# Patient Record
Sex: Female | Born: 1990 | Race: White | Hispanic: No | Marital: Single | Smoking: Former smoker
Health system: Southern US, Community
[De-identification: ages and names within clinical notes are randomized; demographics above are authoritative.]

## PROBLEM LIST (undated history)

## (undated) ENCOUNTER — Inpatient Hospital Stay: Payer: Self-pay

## (undated) DIAGNOSIS — F329 Major depressive disorder, single episode, unspecified: Secondary | ICD-10-CM

## (undated) DIAGNOSIS — Z8742 Personal history of other diseases of the female genital tract: Secondary | ICD-10-CM

## (undated) DIAGNOSIS — F419 Anxiety disorder, unspecified: Secondary | ICD-10-CM

## (undated) HISTORY — DX: Anxiety disorder, unspecified: F41.9

## (undated) HISTORY — PX: NO PAST SURGERIES: SHX2092

## (undated) HISTORY — DX: Personal history of other diseases of the female genital tract: Z87.42

## (undated) HISTORY — DX: Major depressive disorder, single episode, unspecified: F32.9

---
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
## 2008-05-25 ENCOUNTER — Ambulatory Visit: Payer: Self-pay | Admitting: Advanced Practice Midwife

## 2008-10-06 ENCOUNTER — Observation Stay: Payer: Self-pay | Admitting: Obstetrics & Gynecology

## 2008-10-22 ENCOUNTER — Inpatient Hospital Stay: Payer: Self-pay | Admitting: Obstetrics and Gynecology

## 2012-06-10 LAB — HM PAP SMEAR: HM Pap smear: NORMAL

## 2014-03-09 DIAGNOSIS — F329 Major depressive disorder, single episode, unspecified: Secondary | ICD-10-CM | POA: Insufficient documentation

## 2014-03-09 DIAGNOSIS — F419 Anxiety disorder, unspecified: Secondary | ICD-10-CM

## 2014-03-09 DIAGNOSIS — F32A Depression, unspecified: Secondary | ICD-10-CM

## 2014-03-09 HISTORY — DX: Depression, unspecified: F32.A

## 2014-03-09 HISTORY — DX: Anxiety disorder, unspecified: F41.9

## 2014-06-10 NOTE — L&D Delivery Note (Signed)
Delivery Summary for Savannah AuerLora E Kaucher  Labor Events:   Preterm labor:   Rupture date:   Rupture time:   Rupture type: Spontaneous  Fluid Color: Clear  Induction:   Augmentation:   Complications:   Cervical ripening:          Delivery:   Episiotomy:   Lacerations:   Repair suture:   Repair # of packets:   Blood loss (ml): 200 ml   Information for the patient's newborn:  Luna KitchensWinn, Boy Olie [[id number]][[id number]]    Delivery 06/07/2015 10:46 AM by  Vaginal, Spontaneous Delivery Sex:  female Gestational Age: 5025w3d Delivery Clinician:  Nadara Mustardobert P Harris Living?: Yes        APGARS  One minute Five minutes Ten minutes  Skin color: 0   1   1    Heart rate: 1   2   2     Grimace: 0   1   1    Muscle tone: 0   0   1    Breathing: 0   1   2    Totals: 1  5  7     Presentation/position: Vertex     Resuscitation: Yes = See NRP documentation in Infant Chart See NICU Consult (infant's chart)  Cord information: 3 vessels   Disposition of cord blood: No    Blood gases sent? Yes Complications: None  Placenta: Delivered: 06/07/2015 10:56 AM  Spontaneous  Intact appearance Newborn Measurements: Weight: 6 lb 2.8 oz (2800 g)  Height: 18.9"  Head circumference:    Chest circumference:    Other providers: Delivery Nurse Transition RN Registered Nurse Obstetrician Neonatologist Transition RN Gwendolyn Grantatherine H Gaither Myrtha MantisKiara K Jacobs [facility] Joffrey Kerce Christie Davanzo Myrtha MantisKiara K Jacobs  Additional  information: Forceps:   Vacuum:   Breech:   Observed anomalies         Delivery Note At 10:46 AM a viable female was delivered via Vaginal, Spontaneous Delivery (Presentation: cephalic; LOA ).  Delivery was precipitous.  Delivery of fetus was performed by Dr. Annamarie MajorPaul Harris, first responder to precipitous delivery.  APGAR: 1, 5, 7; weight 6 lb 2.8 oz (2800 g).   Placenta status: Intact, Spontaneous.  Cord: 3 vessels with the following complications: None.  Cord pH: 7.15.  Placenta was delivered by Dr. Hildred LaserAnika  Jonita Hirota.   Anesthesia: None  Episiotomy: None Lacerations: None Suture Repair: None Est. Blood Loss (mL): 200  Mom to postpartum.  Baby to NICU.  Hildred Lasernika Monserath Neff 06/08/2015, 8:52 AM

## 2014-11-28 ENCOUNTER — Ambulatory Visit (INDEPENDENT_AMBULATORY_CARE_PROVIDER_SITE_OTHER): Payer: Self-pay

## 2014-11-28 VITALS — BP 114/72 | HR 80 | Ht 64.6 in | Wt 123.4 lb

## 2014-11-28 DIAGNOSIS — Z331 Pregnant state, incidental: Secondary | ICD-10-CM

## 2014-11-28 DIAGNOSIS — Z1389 Encounter for screening for other disorder: Secondary | ICD-10-CM

## 2014-11-28 DIAGNOSIS — Z36 Encounter for antenatal screening of mother: Secondary | ICD-10-CM

## 2014-11-28 DIAGNOSIS — Z113 Encounter for screening for infections with a predominantly sexual mode of transmission: Secondary | ICD-10-CM

## 2014-11-28 DIAGNOSIS — Z369 Encounter for antenatal screening, unspecified: Secondary | ICD-10-CM

## 2014-11-28 DIAGNOSIS — Z8742 Personal history of other diseases of the female genital tract: Secondary | ICD-10-CM

## 2014-11-28 DIAGNOSIS — Z3490 Encounter for supervision of normal pregnancy, unspecified, unspecified trimester: Secondary | ICD-10-CM

## 2014-11-28 DIAGNOSIS — Z3687 Encounter for antenatal screening for uncertain dates: Secondary | ICD-10-CM

## 2014-11-28 NOTE — Patient Instructions (Signed)
Pt. To have dating ultrasound and NT if dates apply at this time.  Pt. Would also like msAFP in 2nd trimester.

## 2014-11-28 NOTE — Progress Notes (Signed)
ZIKA EXPOSURE SCREEN:  The patient has not traveled to a [place] Virus endemic area within the past 6 months, nor has she had unprotected sex with a partner who has travelled to a [place] endemic region within the past 6 months. The patient has been advised to notify us if these factors change any time during this current pregnancy, so adequate testing and monitoring can be initiated.Lise Auer for NOB nurse interview visit. G-2.   P1001-. Pregnancy eduction material explained and given. No cats in the home. Drug screen consent form signed and urine specimen sent to lab. NOB labs ordered. HIV consent form signed. PNV encouraged. NT ordered/msAFP/CYSTIC FIBROSIS ordered. Pt. To follow up with provider after dating scan for NOB physical.  All questions answered.

## 2014-11-30 ENCOUNTER — Other Ambulatory Visit: Payer: BLUE CROSS/BLUE SHIELD

## 2014-11-30 ENCOUNTER — Other Ambulatory Visit: Payer: Self-pay

## 2014-11-30 DIAGNOSIS — Z1389 Encounter for screening for other disorder: Secondary | ICD-10-CM

## 2014-11-30 DIAGNOSIS — Z3687 Encounter for antenatal screening for uncertain dates: Secondary | ICD-10-CM

## 2014-11-30 DIAGNOSIS — Z369 Encounter for antenatal screening, unspecified: Secondary | ICD-10-CM

## 2014-11-30 DIAGNOSIS — Z3491 Encounter for supervision of normal pregnancy, unspecified, first trimester: Secondary | ICD-10-CM | POA: Diagnosis not present

## 2014-11-30 DIAGNOSIS — Z113 Encounter for screening for infections with a predominantly sexual mode of transmission: Secondary | ICD-10-CM

## 2014-11-30 DIAGNOSIS — Z331 Pregnant state, incidental: Secondary | ICD-10-CM | POA: Diagnosis not present

## 2014-11-30 DIAGNOSIS — Z3490 Encounter for supervision of normal pregnancy, unspecified, unspecified trimester: Secondary | ICD-10-CM

## 2014-11-30 DIAGNOSIS — Z36 Encounter for antenatal screening of mother: Secondary | ICD-10-CM | POA: Diagnosis not present

## 2014-12-02 LAB — CBC WITH DIFFERENTIAL/PLATELET
BASOS: 0 %
Basophils Absolute: 0 10*3/uL (ref 0.0–0.2)
EOS (ABSOLUTE): 0.1 10*3/uL (ref 0.0–0.4)
Eos: 1 %
HEMATOCRIT: 37.3 % (ref 34.0–46.6)
HEMOGLOBIN: 12.4 g/dL (ref 11.1–15.9)
Immature Grans (Abs): 0 10*3/uL (ref 0.0–0.1)
Immature Granulocytes: 0 %
LYMPHS ABS: 2.4 10*3/uL (ref 0.7–3.1)
Lymphs: 26 %
MCH: 31.6 pg (ref 26.6–33.0)
MCHC: 33.2 g/dL (ref 31.5–35.7)
MCV: 95 fL (ref 79–97)
Monocytes Absolute: 0.8 10*3/uL (ref 0.1–0.9)
Monocytes: 8 %
NEUTROS ABS: 5.9 10*3/uL (ref 1.4–7.0)
Neutrophils: 65 %
Platelets: 256 10*3/uL (ref 150–379)
RBC: 3.92 x10E6/uL (ref 3.77–5.28)
RDW: 14.4 % (ref 12.3–15.4)
WBC: 9.2 10*3/uL (ref 3.4–10.8)

## 2014-12-02 LAB — RUBELLA ANTIBODY, IGM

## 2014-12-02 LAB — ABO

## 2014-12-02 LAB — HIV ANTIBODY (ROUTINE TESTING W REFLEX): HIV SCREEN 4TH GENERATION: NONREACTIVE

## 2014-12-02 LAB — ANTIBODY SCREEN: Antibody Screen: NEGATIVE

## 2014-12-02 LAB — RH TYPE: Rh Factor: POSITIVE

## 2014-12-02 LAB — RPR: RPR Ser Ql: NONREACTIVE

## 2014-12-02 LAB — HEPATITIS B SURFACE ANTIGEN: Hepatitis B Surface Ag: NEGATIVE

## 2014-12-05 ENCOUNTER — Encounter: Payer: Self-pay | Admitting: Obstetrics and Gynecology

## 2014-12-05 LAB — VARICELLA ZOSTER ANTIBODY, IGM: VARICELLA IGM: 1 {index} — AB (ref 0.00–0.90)

## 2014-12-21 ENCOUNTER — Other Ambulatory Visit: Payer: Self-pay | Admitting: Obstetrics and Gynecology

## 2014-12-21 ENCOUNTER — Telehealth: Payer: Self-pay

## 2014-12-21 ENCOUNTER — Encounter: Payer: Self-pay | Admitting: Obstetrics and Gynecology

## 2014-12-21 ENCOUNTER — Ambulatory Visit: Payer: BLUE CROSS/BLUE SHIELD

## 2014-12-21 ENCOUNTER — Ambulatory Visit (INDEPENDENT_AMBULATORY_CARE_PROVIDER_SITE_OTHER): Payer: BLUE CROSS/BLUE SHIELD | Admitting: Obstetrics and Gynecology

## 2014-12-21 VITALS — BP 95/58 | HR 84 | Wt 127.4 lb

## 2014-12-21 DIAGNOSIS — F329 Major depressive disorder, single episode, unspecified: Secondary | ICD-10-CM

## 2014-12-21 DIAGNOSIS — Z1389 Encounter for screening for other disorder: Secondary | ICD-10-CM

## 2014-12-21 DIAGNOSIS — Z369 Encounter for antenatal screening, unspecified: Secondary | ICD-10-CM

## 2014-12-21 DIAGNOSIS — Z3401 Encounter for supervision of normal first pregnancy, first trimester: Secondary | ICD-10-CM

## 2014-12-21 DIAGNOSIS — F418 Other specified anxiety disorders: Secondary | ICD-10-CM

## 2014-12-21 DIAGNOSIS — Z36 Encounter for antenatal screening of mother: Secondary | ICD-10-CM | POA: Diagnosis not present

## 2014-12-21 DIAGNOSIS — Z283 Underimmunization status: Secondary | ICD-10-CM | POA: Insufficient documentation

## 2014-12-21 DIAGNOSIS — F32A Depression, unspecified: Secondary | ICD-10-CM

## 2014-12-21 DIAGNOSIS — Z3490 Encounter for supervision of normal pregnancy, unspecified, unspecified trimester: Secondary | ICD-10-CM

## 2014-12-21 DIAGNOSIS — O09899 Supervision of other high risk pregnancies, unspecified trimester: Secondary | ICD-10-CM | POA: Insufficient documentation

## 2014-12-21 DIAGNOSIS — Z331 Pregnant state, incidental: Secondary | ICD-10-CM

## 2014-12-21 DIAGNOSIS — O9989 Other specified diseases and conditions complicating pregnancy, childbirth and the puerperium: Secondary | ICD-10-CM

## 2014-12-21 DIAGNOSIS — F419 Anxiety disorder, unspecified: Secondary | ICD-10-CM

## 2014-12-21 DIAGNOSIS — Z3687 Encounter for antenatal screening for uncertain dates: Secondary | ICD-10-CM

## 2014-12-21 LAB — POCT URINALYSIS DIPSTICK
BILIRUBIN UA: NEGATIVE
Blood, UA: NEGATIVE
Glucose, UA: NEGATIVE
KETONES UA: NEGATIVE
LEUKOCYTES UA: NEGATIVE
NITRITE UA: POSITIVE
PH UA: 6.5
Protein, UA: NEGATIVE
Spec Grav, UA: 1.01
Urobilinogen, UA: 0.2

## 2014-12-21 NOTE — Progress Notes (Signed)
Subjective:    Savannah Richmond is being seen today for her first obstetrical visit.  This is not a planned pregnancy. She is at 29w3dgestation. Her obstetrical history is significant for none. Relationship with FOB: significant other, living together. Patient does intend to breast feed. Pregnancy history fully reviewed.  Menstrual History: Obstetric History   G2   P1   T1   P0   A0   TAB0   SAB0   E0   M0   L1     # Outcome Date GA Lbr Len/2nd Weight Sex Delivery Anes PTL Lv  2 Current           1 Term 10/22/08 428w3d7 lb 15 oz (3.6 kg) F Vag-Spont  N Y    Obstetric Comments  ZIKA EXPOSURE SCREEN:  The patient has not traveled to a [place] endemic area within the past 6 months, nor has she had unprotected sex with a partner who has travelled to a [place]emic region within the past 6 months. The patient has been advised  to notify usKoreaf these factors change any time during this current pregnancy, so adequate testing and monitoring can be initiated.LoCindra Evesor NOB nurse interview visit. G2 P1. Pregnancy eduction material explained and given. NOB labs ordered. HIV  consent form signed. PNV encouraged. NT ordered/declined/to discuss with provider. Pt. To follow up with provider today for NOB physical.  All questions answered.      Menarche age: [age]'s last menstrual period was 09/09/2014 (approximate).  Denies h/o abnormal pap smears or STIs.  Last pap last year, 2015, normal per pt ([facility] at [facility]    Past Medical History  Diagnosis Date  . History of irregular menstrual bleeding   . Anxiety and depression 03/09/2014    Past Surgical History  Procedure Laterality Date  . No past surgeries      Family History  Problem Relation Age of Onset  . Cancer Maternal Grandmother 7063  Started as breast cancer then mastasized-stage 4  . Cancer Maternal Grandfather     prostate cancer    History   Social History  . Marital Status: Single    Spouse Name: N/A   . Number of Children: N/A  . Years of Education: N/A   Occupational History  . 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 Smoking status: Former Smoker    Types: Cigarettes    Quit date: 11/07/2014  . Smokeless tobacco: Never Used  . Alcohol Use: No  . Drug Use: No  . Sexual Activity:    Partners: Male   Other Topics Concern  . Not on file   Social History Narrative   WORKS WITH [employer]    Current Outpatient Prescriptions on File Prior to Visit  Medication Sig Dispense Refill  . citalopram (CELEXA) 40 MG tablet Take 1 tablet by mouth daily.    . Prenatal Vit-Fe Fumarate-FA (MULTIVITAMIN-PRENATAL) 27-0.8 MG TABS tablet Take 1 tablet by mouth daily at 12 noon.     No current facility-administered medications on file prior to visit.    No Known Allergies    Review of Systems General:Not Present- Fever, Weight Loss and Weight Gain. Skin:Not Present- Rash. HEENT:Not Present- Blurred Vision, Headache and Bleeding Gums. Respiratory:Not Present- Difficulty Breathing. Breast:Not Present- Breast Mass. Cardiovascular:Not Present- Chest Pain, Elevated Blood Pressure, Fainting / Blacking Out and Shortness of Breath. Gastrointestinal:Not Present- Abdominal Pain, Constipation, Nausea and  Vomiting. Female Genitourinary:Not Present- Frequency, Painful Urination, Pelvic Pain, Vaginal Bleeding, Vaginal Discharge, Contractions, regular, Fetal Movements Decreased, Urinary Complaints and Vaginal Fluid. Musculoskeletal:Not Present- Back Pain and Leg Cramps. Neurological:Not Present- Dizziness. Psychiatric:Not Present- Depression.    Objective:   Blood pressure 95/58, pulse 84, weight 127 lb 6.4 oz (57.788 kg), last menstrual period 09/09/2014. Marland Kitchen General Appearance:    Alert, cooperative, no distress, appears stated age  Head:    Normocephalic, without obvious abnormality, atraumatic  Eyes:    PERRL, conjunctiva/corneas clear, EOM's intact, both eyes   Ears:    Normal external ear canals, both ears  Nose:   Nares normal, septum midline, mucosa normal, no drainage or sinus tenderness  Throat:   Lips, mucosa, and tongue normal; teeth and gums normal  Neck:   Supple, symmetrical, trachea midline, no adenopathy; thyroid: no enlargement/tenderness/nodules; no       carotid bruit or JVD  Back:     Symmetric, no curvature, ROM normal, no CVA tenderness  Lungs:     Clear to auscultation bilaterally, respirations unlabored  Chest Wall:    No tenderness or deformity   Heart:    Regular rate and rhythm, S1 and S2 normal, no murmur, rub or gallop  Breast Exam:    No tenderness, masses, or nipple abnormality  Abdomen:     Soft, non-tender, bowel sounds active all four quadrants, no masses, no organomegaly.  FHT 157 bpm  Genitalia:    Pelvic:external genitalia normal, vagina with lesions, discharge, or tenderness, rectovaginal septum       normal. Cervix normal in appearance, no cervical motion tenderness, no adnexal masses or tenderness.     Pregnancy positive findings: uterine enlargement: 12 wk size, nontender.   Rectal:    Normal external sphincter.  No hemorrhoids appreciated. Internal exam not done.   Extremities:   Extremities normal, atraumatic, no cyanosis or edema  Pulses:   2+ and symmetric all extremities  Skin:   Skin color, texture, turgor normal, no rashes or lesions  Lymph nodes:   Cervical, supraclavicular, and axillary nodes normal  Neurologic:   CNII-XII intact, normal strength, sensation and reflexes throughout      Assessment:    Pregnancy at 12 and 3/7 weeks    Plan:    Initial labs reveiwed. Rubella non-immune. Will need MMR postpartum.  Prenatal vitamins. Problem list reviewed and updated. AFP3 discussed: performed today.  Role of ultrasound in pregnancy discussed; fetal survey: to be ordered at appropriate gestational age. H/o anxiety and depression - risks vs benefits of antidepressant use in pregnancy discussed.  Encouraged patient to wean off Celexa. Currently on 40 mg daily.  To decrease to 20 mg x 2 weeks, then to wean off.  Advised to resume normal dose if she becomes symptomatic and can change to different anti-depressant at next visit.  New OB counseling:  The patient has been given an overview regarding routine prenatal care.  Recommendations regarding diet, weight gain, and exercise in pregnancy were given.  Prenatal testing, optional genetic testing, and ultrasound use in pregnancy were reviewed.  Benefits of Breast Feeding were discussed. The patient is encouraged to consider nursing her baby post partum. 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  Follow up in 4 weeks.   Rubie Maid, MD [facility]

## 2014-12-21 NOTE — Telephone Encounter (Signed)
Called pt per Dr.Cherry and informed her of the need to wean off Celexa. Advised pt that she needs to break 40 mg tablets in half for 1-2 weeks, then go to taking one tablet every other day and then stop medication completely. Pt gave verbal understanding, states that she has not tried to wean off previously. Advised pt that if her mood becomes unstable or she feels as if she cannot stop medication give us a call back and continue with medication.

## 2014-12-23 LAB — FIRST TRIMESTER SCREEN W/NT
CRL: 64 mm
DIA MOM: 0.9
DIA Value: 239.8 pg/mL
GEST AGE-COLLECT: 12.7 wk
HCG VALUE: 90.8 [IU]/mL
MATERNAL AGE AT EDD: 26.1 a
NUCHAL TRANSLUCENCY: 2 mm
NUMBER OF FETUSES: 1
Nuchal Translucency MoM: 1.43
PAPP-A MOM: 0.37
PAPP-A Value: 497.3 ng/mL
Test Results:: NEGATIVE
WEIGHT: 123 [lb_av]
hCG MoM: 0.89

## 2014-12-23 LAB — URINE CULTURE

## 2014-12-23 LAB — NUSWAB VAGINITIS PLUS (VG+)
Atopobium vaginae: HIGH Score — AB
BVAB 2: HIGH Score — AB
Candida glabrata, NAA: NEGATIVE
Megasphaera 1: HIGH Score — AB

## 2014-12-26 ENCOUNTER — Telehealth: Payer: Self-pay

## 2014-12-26 DIAGNOSIS — N760 Acute vaginitis: Principal | ICD-10-CM

## 2014-12-26 DIAGNOSIS — B9689 Other specified bacterial agents as the cause of diseases classified elsewhere: Secondary | ICD-10-CM

## 2014-12-26 MED ORDER — METRONIDAZOLE 500 MG PO TABS: 500.0000 mg | ORAL_TABLET | Freq: Two times a day (BID) | ORAL | Status: DC

## 2014-12-26 NOTE — Telephone Encounter (Signed)
Called pt, LM for pt to call back.

## 2014-12-26 NOTE — Telephone Encounter (Signed)
-----   Message from Hildred LaserAnika Cherry, MD sent at 12/25/2014  4:20 PM EDT ----- Patient with NuSwab positive for BV.  Needs treatment with Flagyl PO (500 g BID x 7 days) or vaginal gel (0.75% gel PV nightly x 5 nights).

## 2014-12-28 MED ORDER — METRONIDAZOLE 500 MG PO TABS: 500.0000 mg | ORAL_TABLET | Freq: Two times a day (BID) | ORAL | Status: DC

## 2014-12-28 NOTE — Telephone Encounter (Signed)
Called pt informed her of BV and RX sent in, as well as normal first trimester screen.

## 2014-12-28 NOTE — Telephone Encounter (Signed)
Called Mobile and home phone numbers, no answer at either. Left message for pt to call back on Mobile number.

## 2015-01-18 ENCOUNTER — Encounter: Payer: BLUE CROSS/BLUE SHIELD | Admitting: Obstetrics and Gynecology

## 2015-01-24 ENCOUNTER — Ambulatory Visit (INDEPENDENT_AMBULATORY_CARE_PROVIDER_SITE_OTHER): Payer: BLUE CROSS/BLUE SHIELD | Admitting: Obstetrics and Gynecology

## 2015-01-24 VITALS — BP 105/63 | HR 67 | Temp 98.3°F | Wt 128.3 lb

## 2015-01-24 DIAGNOSIS — Z3482 Encounter for supervision of other normal pregnancy, second trimester: Secondary | ICD-10-CM

## 2015-01-24 DIAGNOSIS — Z3492 Encounter for supervision of normal pregnancy, unspecified, second trimester: Secondary | ICD-10-CM

## 2015-01-24 NOTE — Progress Notes (Signed)
ROB: Patient c/o occasional tightening in abdomen, lasts for a few seconds and then resolves.  Denies cramping, vaginal bleeding.  Given reassurance.  Reports compliance with Flagyl for recent BV infection. For serum AFP today.  RTC in 4 weeks. For anatomy scan at that time.

## 2015-01-30 LAB — AFP, SERUM, OPEN SPINA BIFIDA
AFP MoM: 2.34
AFP Value: 97.2 ng/mL
GEST. AGE ON COLLECTION DATE: 17.2 wk
Maternal Age At EDD: 26.1 years
OSBR Risk 1 IN: 393
TEST RESULTS AFP: NEGATIVE
Weight: 128 [lb_av]

## 2015-02-06 ENCOUNTER — Telehealth: Payer: Self-pay

## 2015-02-06 NOTE — Telephone Encounter (Signed)
Pt.notified

## 2015-02-06 NOTE — Telephone Encounter (Signed)
-----   Message from Savannah Laser, MD sent at 01/31/2015  8:21 AM EDT ----- Please inform patient of negative msAFP results (negative screening for spina bifida)

## 2015-02-21 ENCOUNTER — Ambulatory Visit: Payer: BLUE CROSS/BLUE SHIELD

## 2015-02-21 ENCOUNTER — Encounter: Payer: BLUE CROSS/BLUE SHIELD | Admitting: Obstetrics and Gynecology

## 2015-02-21 DIAGNOSIS — Z3482 Encounter for supervision of other normal pregnancy, second trimester: Secondary | ICD-10-CM

## 2015-02-21 DIAGNOSIS — Z3492 Encounter for supervision of normal pregnancy, unspecified, second trimester: Secondary | ICD-10-CM

## 2015-02-28 ENCOUNTER — Ambulatory Visit (INDEPENDENT_AMBULATORY_CARE_PROVIDER_SITE_OTHER): Payer: BLUE CROSS/BLUE SHIELD | Admitting: Obstetrics and Gynecology

## 2015-02-28 VITALS — BP 127/79 | HR 91 | Wt 137.4 lb

## 2015-02-28 DIAGNOSIS — Z23 Encounter for immunization: Secondary | ICD-10-CM

## 2015-02-28 DIAGNOSIS — Z3482 Encounter for supervision of other normal pregnancy, second trimester: Secondary | ICD-10-CM

## 2015-02-28 DIAGNOSIS — Z3480 Encounter for supervision of other normal pregnancy, unspecified trimester: Secondary | ICD-10-CM | POA: Insufficient documentation

## 2015-02-28 DIAGNOSIS — Z331 Pregnant state, incidental: Secondary | ICD-10-CM

## 2015-02-28 DIAGNOSIS — Z3492 Encounter for supervision of normal pregnancy, unspecified, second trimester: Secondary | ICD-10-CM

## 2015-02-28 LAB — POCT URINALYSIS DIPSTICK
Bilirubin, UA: NEGATIVE
Glucose, UA: NEGATIVE
KETONES UA: NEGATIVE
Leukocytes, UA: NEGATIVE
Nitrite, UA: NEGATIVE
PROTEIN UA: NEGATIVE
RBC UA: NEGATIVE
SPEC GRAV UA: 1.01
Urobilinogen, UA: NEGATIVE
pH, UA: 6

## 2015-02-28 MED ORDER — INFLUENZA VAC SPLIT QUAD 0.5 ML IM SUSY
0.5000 mL | PREFILLED_SYRINGE | Freq: Once | INTRAMUSCULAR | Status: AC
  Administered 2015-02-28: 0.5 mL via INTRAMUSCULAR

## 2015-02-28 NOTE — Progress Notes (Signed)
ROB: Patient doing well, no complaints.  S/p anatomy scan today, normal anatomy, female fetus.  Flu vaccine given today. RTC in 4 weeks.

## 2015-02-28 NOTE — Progress Notes (Signed)
Flu vaccine given today. Pt doing well.

## 2015-03-28 ENCOUNTER — Ambulatory Visit (INDEPENDENT_AMBULATORY_CARE_PROVIDER_SITE_OTHER): Payer: BLUE CROSS/BLUE SHIELD | Admitting: Obstetrics and Gynecology

## 2015-03-28 VITALS — BP 106/61 | HR 89 | Wt 145.5 lb

## 2015-03-28 DIAGNOSIS — Z3492 Encounter for supervision of normal pregnancy, unspecified, second trimester: Secondary | ICD-10-CM

## 2015-03-28 DIAGNOSIS — Z3482 Encounter for supervision of other normal pregnancy, second trimester: Secondary | ICD-10-CM

## 2015-03-28 LAB — POCT URINALYSIS DIP (MANUAL ENTRY)
BILIRUBIN UA: NEGATIVE
Glucose, UA: NEGATIVE
Ketones, POC UA: NEGATIVE
Leukocytes, UA: NEGATIVE
Nitrite, UA: NEGATIVE
PROTEIN UA: NEGATIVE
RBC UA: NEGATIVE
Urobilinogen, UA: 0.2
pH, UA: 6

## 2015-03-28 NOTE — Progress Notes (Signed)
ROB: Patient doing well, denies complaints.  RTC in 2 weeks, for glucola, Tdap, and blood consent at that time.

## 2015-04-11 ENCOUNTER — Other Ambulatory Visit: Payer: Self-pay

## 2015-04-11 ENCOUNTER — Ambulatory Visit (INDEPENDENT_AMBULATORY_CARE_PROVIDER_SITE_OTHER): Payer: BLUE CROSS/BLUE SHIELD | Admitting: Obstetrics and Gynecology

## 2015-04-11 ENCOUNTER — Other Ambulatory Visit: Payer: BLUE CROSS/BLUE SHIELD

## 2015-04-11 ENCOUNTER — Encounter: Payer: BLUE CROSS/BLUE SHIELD | Admitting: Obstetrics and Gynecology

## 2015-04-11 VITALS — BP 99/65 | HR 94 | Wt 148.2 lb

## 2015-04-11 DIAGNOSIS — Z23 Encounter for immunization: Secondary | ICD-10-CM

## 2015-04-11 DIAGNOSIS — Z3482 Encounter for supervision of other normal pregnancy, second trimester: Secondary | ICD-10-CM

## 2015-04-11 DIAGNOSIS — Z3493 Encounter for supervision of normal pregnancy, unspecified, third trimester: Secondary | ICD-10-CM | POA: Diagnosis not present

## 2015-04-11 LAB — POCT URINALYSIS DIPSTICK
BILIRUBIN UA: NEGATIVE
GLUCOSE UA: NEGATIVE
Ketones, UA: NEGATIVE
NITRITE UA: NEGATIVE
Protein, UA: NEGATIVE
Spec Grav, UA: 1.015
UROBILINOGEN UA: NEGATIVE
pH, UA: 7

## 2015-04-11 NOTE — Progress Notes (Signed)
ROB: Patient doing well, no complaints.  For glucola, Tdap today.  Signed blood consent.  RTC in 2 weeks.  Need to begin discussion of weaning from Celexa next visit.

## 2015-04-12 LAB — HEMOGLOBIN AND HEMATOCRIT, BLOOD
HEMATOCRIT: 28.8 % — AB (ref 34.0–46.6)
HEMOGLOBIN: 9.7 g/dL — AB (ref 11.1–15.9)

## 2015-04-12 LAB — GLUCOSE, 1 HOUR GESTATIONAL: Gestational Diabetes Screen: 61 mg/dL — ABNORMAL LOW (ref 65–139)

## 2015-04-14 ENCOUNTER — Telehealth: Payer: Self-pay

## 2015-04-14 NOTE — Telephone Encounter (Signed)
Pt informed of normal 1hr and need for iron due to anemia.

## 2015-04-14 NOTE — Telephone Encounter (Signed)
-----   Message from Hildred LaserAnika Cherry, MD sent at 04/12/2015  2:54 PM EDT ----- Please inform patient of normal glucola.  Has mild anemia, should begin taking an additional iron supplement daily.

## 2015-04-25 ENCOUNTER — Encounter: Payer: Self-pay | Admitting: Obstetrics and Gynecology

## 2015-04-25 ENCOUNTER — Ambulatory Visit (INDEPENDENT_AMBULATORY_CARE_PROVIDER_SITE_OTHER): Payer: BLUE CROSS/BLUE SHIELD | Admitting: Obstetrics and Gynecology

## 2015-04-25 VITALS — BP 101/62 | HR 97 | Wt 151.1 lb

## 2015-04-25 DIAGNOSIS — O99013 Anemia complicating pregnancy, third trimester: Secondary | ICD-10-CM

## 2015-04-25 DIAGNOSIS — Z3493 Encounter for supervision of normal pregnancy, unspecified, third trimester: Secondary | ICD-10-CM

## 2015-04-25 DIAGNOSIS — D649 Anemia, unspecified: Secondary | ICD-10-CM | POA: Insufficient documentation

## 2015-04-25 LAB — POCT URINALYSIS DIPSTICK
Bilirubin, UA: NEGATIVE
Glucose, UA: NEGATIVE
KETONES UA: NEGATIVE
Nitrite, UA: NEGATIVE
PH UA: 7.5
SPEC GRAV UA: 1.01
Urobilinogen, UA: NEGATIVE

## 2015-04-25 NOTE — Progress Notes (Signed)
ROB: Patient doing well, no complaints.  Normal glucola screen.  Mild anemia noted on 28 week labs, recommended daily iron supplement in addition to PNV. Discussed limiting further weight gain until end of pregnancy (current weight gain 36 lbs). RTC in 2 weeks.  Desires Nexplanon for contraception.  Will breastfeed.

## 2015-05-05 ENCOUNTER — Observation Stay
Admission: EM | Admit: 2015-05-05 | Discharge: 2015-05-06 | Disposition: A | Discharge status: Home / Self Care | Payer: BLUE CROSS/BLUE SHIELD | Attending: Obstetrics and Gynecology | Admitting: Obstetrics and Gynecology

## 2015-05-05 DIAGNOSIS — D649 Anemia, unspecified: Secondary | ICD-10-CM | POA: Insufficient documentation

## 2015-05-05 DIAGNOSIS — F419 Anxiety disorder, unspecified: Secondary | ICD-10-CM | POA: Insufficient documentation

## 2015-05-05 DIAGNOSIS — Z87891 Personal history of nicotine dependence: Secondary | ICD-10-CM | POA: Diagnosis not present

## 2015-05-05 DIAGNOSIS — O99013 Anemia complicating pregnancy, third trimester: Secondary | ICD-10-CM | POA: Insufficient documentation

## 2015-05-05 DIAGNOSIS — Z803 Family history of malignant neoplasm of breast: Secondary | ICD-10-CM | POA: Diagnosis not present

## 2015-05-05 DIAGNOSIS — F329 Major depressive disorder, single episode, unspecified: Secondary | ICD-10-CM | POA: Diagnosis not present

## 2015-05-05 DIAGNOSIS — Z79899 Other long term (current) drug therapy: Secondary | ICD-10-CM | POA: Insufficient documentation

## 2015-05-05 DIAGNOSIS — Z3A31 31 weeks gestation of pregnancy: Secondary | ICD-10-CM | POA: Diagnosis not present

## 2015-05-05 DIAGNOSIS — Z8042 Family history of malignant neoplasm of prostate: Secondary | ICD-10-CM | POA: Insufficient documentation

## 2015-05-05 LAB — URINALYSIS COMPLETE WITH MICROSCOPIC (ARMC ONLY)
BILIRUBIN URINE: NEGATIVE
Bacteria, UA: NONE SEEN
GLUCOSE, UA: NEGATIVE mg/dL
Leukocytes, UA: NEGATIVE
Nitrite: NEGATIVE
PROTEIN: NEGATIVE mg/dL
Specific Gravity, Urine: 1.009 (ref 1.005–1.030)
pH: 6 (ref 5.0–8.0)

## 2015-05-05 LAB — FETAL FIBRONECTIN: FETAL FIBRONECTIN: POSITIVE — AB

## 2015-05-05 MED ORDER — PRENATAL MULTIVITAMIN CH
1.0000 | ORAL_TABLET | Freq: Every day | ORAL | Status: DC
  Administered 2015-05-06: 1 via ORAL
  Filled 2015-05-05 (×2): qty 1

## 2015-05-05 MED ORDER — NIFEDIPINE 10 MG PO CAPS
10.0000 mg | ORAL_CAPSULE | Freq: Four times a day (QID) | ORAL | Status: DC
  Administered 2015-05-06 (×3): 10 mg via ORAL
  Filled 2015-05-05 (×7): qty 1

## 2015-05-05 MED ORDER — ACETAMINOPHEN 325 MG PO TABS: 650.0000 mg | ORAL_TABLET | ORAL | Status: DC | PRN

## 2015-05-05 MED ORDER — TERBUTALINE SULFATE 1 MG/ML IJ SOLN
0.2500 mg | INTRAMUSCULAR | Status: DC | PRN
  Administered 2015-05-05 (×2): 0.25 mg via SUBCUTANEOUS
  Filled 2015-05-05 (×2): qty 1

## 2015-05-05 MED ORDER — ONDANSETRON HCL 4 MG/2ML IJ SOLN
4.0000 mg | Freq: Four times a day (QID) | INTRAMUSCULAR | Status: DC | PRN
  Administered 2015-05-05: 4 mg via INTRAVENOUS
  Filled 2015-05-05: qty 2

## 2015-05-05 MED ORDER — CALCIUM CARBONATE ANTACID 500 MG PO CHEW: 2.0000 | CHEWABLE_TABLET | ORAL | Status: DC | PRN

## 2015-05-05 MED ORDER — ZOLPIDEM TARTRATE 5 MG PO TABS
5.0000 mg | ORAL_TABLET | Freq: Every evening | ORAL | Status: DC | PRN
  Administered 2015-05-06: 5 mg via ORAL
  Filled 2015-05-05: qty 1

## 2015-05-05 MED ORDER — DOCUSATE SODIUM 100 MG PO CAPS
100.0000 mg | ORAL_CAPSULE | Freq: Every day | ORAL | Status: DC
  Administered 2015-05-06: 100 mg via ORAL
  Filled 2015-05-05: qty 1

## 2015-05-05 MED ORDER — BETAMETHASONE SOD PHOS & ACET 6 (3-3) MG/ML IJ SUSP
INTRAMUSCULAR | Status: AC
  Administered 2015-05-05: 12 mg via INTRAMUSCULAR
  Filled 2015-05-05: qty 1

## 2015-05-05 MED ORDER — NIFEDIPINE 10 MG PO CAPS
20.0000 mg | ORAL_CAPSULE | Freq: Once | ORAL | Status: AC
  Administered 2015-05-05: 20 mg via ORAL
  Filled 2015-05-05: qty 2

## 2015-05-05 MED ORDER — LACTATED RINGERS IV BOLUS (SEPSIS)
1000.0000 mL | Freq: Once | INTRAVENOUS | Status: AC
  Administered 2015-05-05: 1000 mL via INTRAVENOUS

## 2015-05-05 MED ORDER — ACETAMINOPHEN 325 MG PO TABS
650.0000 mg | ORAL_TABLET | ORAL | Status: DC | PRN
  Administered 2015-05-06: 650 mg via ORAL
  Filled 2015-05-05: qty 2

## 2015-05-05 MED ORDER — BETAMETHASONE SOD PHOS & ACET 6 (3-3) MG/ML IJ SUSP
12.0000 mg | Freq: Every day | INTRAMUSCULAR | Status: AC
  Administered 2015-05-05 – 2015-05-06 (×2): 12 mg via INTRAMUSCULAR
  Filled 2015-05-05: qty 2

## 2015-05-05 MED ORDER — LACTATED RINGERS IV SOLN
INTRAVENOUS | Status: DC
  Administered 2015-05-06: 09:00:00 via INTRAVENOUS

## 2015-05-05 NOTE — Plan of Care (Signed)
Pt states she started hurting yesterday. Went shopping this morning. States she has only had 3 bottles water today. efm applied . Shows contractions. Will notify dr Valentino Saxoncherry

## 2015-05-05 NOTE — OB Triage Note (Signed)
Pt here with h/o contractions

## 2015-05-05 NOTE — H&P (Signed)
Obstetric History and Physical  Savannah Richmond is a 24 y.o. G2P1001 with IUP at 5933w5d presenting for contractions x 1 day. Patient states she has been having  irregular, every 10-15 minutes contractions, none vaginal bleeding, intact membranes, with active fetal movement.  Also reports back pain. Denies recent intercourse, vaginal beeding.   Prenatal Course Source of Care: [facility] with onset of care at 12 weeks  Pregnancy complications or risks: Patient Active Problem List   Diagnosis Date Noted  . Preterm contractions 05/05/2015  . Anemia of pregnancy in third trimester 04/25/2015  . Supervision of normal IUP (intrauterine pregnancy) in multigravida 02/28/2015  . History of irregular menstrual bleeding 11/28/2014  . Anxiety and depression 03/09/2014    Prenatal labs and studies: ABO, Rh: A/Positive/-- (06/22 1626) Antibody: Negative (06/22 1625) Rubella: <20.0 (06/22 1625) RPR: Non Reactive (06/22 1625)  HBsAg: Negative (06/22 1625)  HIV: Non Reactive (06/22 1625)  GBS: unknown 1 hr Glucola   Genetic screening normal Anatomy US normal   Past Medical History  Diagnosis Date  . History of irregular menstrual bleeding   . Anxiety and depression 03/09/2014    Past Surgical History  Procedure Laterality Date  . No past surgeries      OB History  Gravida Para Term Preterm AB SAB TAB Ectopic Multiple Living  2 1 1       1     # Outcome Date GA Lbr Len/2nd Weight Sex Delivery Anes PTL Lv  2 Current           1 Term 10/22/08 20109w3d  7 lb 15 oz (3.6 kg) F Vag-Spont  N Y    Obstetric Comments  ZIKA EXPOSURE SCREEN:    The patient has not traveled to a [place] endemic area within the past 6 months, nor has she had unprotected sex with a partner who has travelled to a [place] endemic region within the past 6 months.   to notify us if these factors change any time during this current pregnancy, so adequate testing and monitoring can be initiated.Savannah AuerLora E Richmond for NOB  nurse interview visit. G-.   P-. Pregnancy eduction material explained and given. NOB labs ordered. HIV consent form signed. PNV encouraged. NT ordered/declined/to discuss with provider. Pt. To follow up with provider in __ weeks for NOB physical.  All questions answered.      Social History   Social History  . Marital Status: Single    Spouse Name: N/A  . Number of Children: N/A  . Years of Education: N/A   Occupational History  . 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 Smoking status: Former Smoker    Types: Cigarettes    Quit date: 11/07/2014  . Smokeless tobacco: Never Used  . Alcohol Use: No  . Drug Use: No  . Sexual Activity:    Partners: Male   Other Topics Concern  . Not on file   Social History Narrative   WORKS WITH [employer]    Family History  Problem Relation Age of Onset  . Cancer Maternal Grandmother 5070    Started as breast cancer then mastasized-stage 4  . Cancer Maternal Grandfather     prostate cancer    Prescriptions prior to admission  Medication Sig Dispense Refill Last Dose  . citalopram (CELEXA) 40 MG tablet Take 1 tablet by mouth.    Taking  . Prenatal Vit-Fe Fumarate-FA (MULTIVITAMIN-PRENATAL) 27-0.8 MG TABS tablet Take 1 tablet by  mouth daily at 12 noon.   Taking    No Known Allergies  Review of Systems: Negative except for what is mentioned in HPI.  Physical Exam: BP 110/60 mmHg  Pulse 103  Temp(Src) 97.7 F (36.5 C) (Oral)  Resp 18  LMP 09/09/2014 (Approximate) CONSTITUTIONAL: Well-developed, well-nourished female in no acute distress.  HENT:  Normocephalic, atraumatic, External right and left ear normal. Oropharynx is clear and moist EYES: Conjunctivae and EOM are normal. Pupils are equal, round, and reactive to light. No scleral icterus.  NECK: Normal range of motion, supple, no masses SKIN: Skin is warm and dry. No rash noted. Not diaphoretic. No erythema. No pallor. NEUROLGIC: Alert and oriented to  person, place, and time. Normal reflexes, muscle tone coordination. No cranial nerve deficit noted. PSYCHIATRIC: Normal mood and affect. Normal behavior. Normal judgment and thought content. CARDIOVASCULAR: Normal heart rate noted, regular rhythm RESPIRATORY: Effort and breath sounds normal, no problems with respiration noted ABDOMEN: Soft, nontender, nondistended, gravid. MUSCULOSKELETAL: Normal range of motion. No edema and no tenderness. 2+ distal pulses.  Cervical Exam: Dilatation 2cm   Effacement 40%   Station -3   Presentation: cephalic FHT:  Baseline rate 140 bpm   Variability moderate  Accelerations present   Decelerations none Contractions: Every 2-3 mins   Pertinent Labs/Studies:   Results for orders placed or performed during the hospital encounter of 05/05/15 (from the past 24 hour(s))  Fetal fibronectin     Status: Abnormal   Collection Time: 05/05/15  7:56 PM  Result Value Ref Range   Fetal Fibronectin POSITIVE (A) NEGATIVE   Appearance, FETFIB CLEAR CLEAR    Assessment : Savannah Richmond is a 24 y.o. G2P1001 at [redacted]w[redacted]d being admitted for preterm contractions, with positive FFN.  Plan: 1. Patient given 1L blous of LR and 2 doses of terbutaline, with short term spacing of contractions (~q 20 minutes), followed by resumption of 2-5 min contractions.  Will admit to observation and initiate Nifedipine for tocolysis. 2. Cervix unchanged after 2 hr cervical check.  3. Will administer dose of antenatal steroids for positive FFN.  4. Tylenol or Vocodin prn for pain as needed.  5. GBS and GC/Cl ordered, UA sent.     Hildred Laser, MD [facility]

## 2015-05-05 NOTE — Plan of Care (Signed)
Dr Valentino Saxoncherry notified of contractions q 2-3 min. Will be in to see pt.

## 2015-05-05 NOTE — Progress Notes (Signed)
Pt vomited shortly after PO procardia administration and vomited meds. MD called, informed of UC pattern and small bleeding observed with gbs culture. MD plans to enter orders for nausea and re-attempt po procardia administration. Order also received to recheck cervix to eval for labor progression.

## 2015-05-06 ENCOUNTER — Encounter: Payer: Self-pay | Admitting: Obstetrics and Gynecology

## 2015-05-06 LAB — WET PREP, GENITAL
SPERM: NONE SEEN
TRICH WET PREP: NONE SEEN
YEAST WET PREP: NONE SEEN

## 2015-05-06 LAB — TYPE AND SCREEN
ABO/RH(D): A POS
Antibody Screen: NEGATIVE

## 2015-05-06 LAB — CHLAMYDIA/NGC RT PCR (ARMC ONLY)
CHLAMYDIA TR: NOT DETECTED
N gonorrhoeae: NOT DETECTED

## 2015-05-06 LAB — ABO/RH: ABO/RH(D): A POS

## 2015-05-06 MED ORDER — NIFEDIPINE 10 MG PO CAPS: 10.0000 mg | ORAL_CAPSULE | Freq: Four times a day (QID) | ORAL | Status: DC

## 2015-05-06 MED ORDER — CITALOPRAM HYDROBROMIDE 20 MG PO TABS
40.0000 mg | ORAL_TABLET | Freq: Every day | ORAL | Status: DC
  Administered 2015-05-06: 40 mg via ORAL
  Filled 2015-05-06: qty 2

## 2015-05-06 NOTE — Progress Notes (Signed)
Pt. D/C to home. Pt.'s Mother drove Pt. Home. Pt. Instructed on D/C Instructions and Nifedipine Prescription given to Pt. Who V/O Purpose, dose and frequency as well as Side effects. Work Engineer, productionxcuse given to QUALCOMMPt. Who V/O of activity order. Pt. Is alert and oriented with cheerful affect. Color good, skin w&d. BBS clear. Abd. Is soft and Pt. Denies contractions. She does not have any vaginal discharge or bleeding. Pt. Escorted down stairs via wheel chair.

## 2015-05-06 NOTE — Discharge Instructions (Signed)
Preterm Labor Information Preterm labor is when labor starts at less than 37 weeks of pregnancy. The normal length of a pregnancy is 39 to 41 weeks. CAUSES Often, there is no identifiable underlying cause as to why a woman goes into preterm labor. One of the most common known causes of preterm labor is infection. Infections of the uterus, cervix, vagina, amniotic sac, bladder, kidney, or even the lungs (pneumonia) can cause labor to start. Other suspected causes of preterm labor include:   Urogenital infections, such as yeast infections and bacterial vaginosis.   Uterine abnormalities (uterine shape, uterine septum, fibroids, or bleeding from the placenta).   A cervix that has been operated on (it may fail to stay closed).   Malformations in the fetus.   Multiple gestations (twins, triplets, and so on).   Breakage of the amniotic sac.  RISK FACTORS  Having a previous history of preterm labor.   Having premature rupture of membranes (PROM).   Having a placenta that covers the opening of the cervix (placenta previa).   Having a placenta that separates from the uterus (placental abruption).   Having a cervix that is too weak to hold the fetus in the uterus (incompetent cervix).   Having too much fluid in the amniotic sac (polyhydramnios).   Taking illegal drugs or smoking while pregnant.   Not gaining enough weight while pregnant.   Being younger than 6418 and older than 26 years old.   Having a low socioeconomic status.   Being African American. SYMPTOMS Signs and symptoms of preterm labor include:   Menstrual-like cramps, abdominal pain, or back pain.  Uterine contractions that are regular, as frequent as six in an hour, regardless of their intensity (may be mild or painful).  Contractions that start on the top of the uterus and spread down to the lower abdomen and back.   A sense of increased pelvic pressure.   A watery or bloody mucus discharge that  comes from the vagina.  TREATMENT Depending on the length of the pregnancy and other circumstances, your health care provider may suggest bed rest. If necessary, there are medicines that can be given to stop contractions and to mature the fetal lungs. If labor happens before 34 weeks of pregnancy, a prolonged hospital stay may be recommended. Treatment depends on the condition of both you and the fetus.  WHAT SHOULD YOU DO IF YOU THINK YOU ARE IN PRETERM LABOR? Call your health care provider right away. You will need to go to the hospital to get checked immediately. HOW CAN YOU PREVENT PRETERM LABOR IN FUTURE PREGNANCIES? You should:   Stop smoking if you smoke.  Maintain healthy weight gain and avoid chemicals and drugs that are not necessary.  Be watchful for any type of infection.  Inform your health care provider if you have a known history of preterm labor.   This information is not intended to replace advice given to you by your health care provider. Make sure you discuss any questions you have with your health care provider.   Document Released: 08/17/2003 Document Revised: 01/27/2013 Document Reviewed: 06/29/2012 Elsevier Interactive Patient Education 2016 [employer]   Please Pick up your Nifedipine Prescription Sunday Morning, November 27,2016 as soon as you can and Take as ordered.

## 2015-05-06 NOTE — Progress Notes (Signed)
  Antenatal Progress Note  Subjective:     Patient ID: Savannah Richmond is a 24 y.o. female 6338w6d, Estimated Date of Delivery: 07/02/15 by Patient's last menstrual period was 09/09/2014 (approximate). consistent with 1st trimester sono who was admitted for preterm labor.  HD# 2.   Subjective:  Patient denies complaints today.  Tolerating diet.  Still notes occasionally feeling contractions but not painful.  Did not vaginal spotting yesterday evening, but none since then.  Review of Systems Denies leakage of fluids, and reports good fetal movement.     Objective:   Filed Vitals:   05/05/15 2351 05/05/15 2352 05/06/15 0436 05/06/15 0809  BP: 116/68 116/68 94/45 107/66  Pulse:  97 77 91  Temp:    97.9 F (36.6 C)  TempSrc:    Oral  Resp:      Height:    5\' 4"  (1.626 m)  Weight:    150 lb (68.04 kg)    General appearance: alert and no distress Lungs: clear to auscultation bilaterally Heart: regular rate and rhythm, S1, S2 normal, no murmur, click, rub or gallop Abdomen: soft, non-tender; bowel sounds normal; no masses,  no organomegaly and gravid Pelvic: cervix unchanged on speculum exam, ~ 2 cm dilated, mucoid discharge present Extremities: extremities normal, atraumatic, no cyanosis or edema   FHT: baseline 145 bpm, accels present, decels absent.  Variability: moderate Toco: contractions q 3-8 min   Labs:   Results for orders placed or performed during the hospital encounter of 05/05/15  Chlamydia/NGC rt PCR ([facility] only)  Result Value Ref Range   Specimen source GC/Chlam URINE, RANDOM    Chlamydia Tr NOT DETECTED NOT DETECTED   N gonorrhoeae NOT DETECTED NOT DETECTED  Fetal fibronectin  Result Value Ref Range   Fetal Fibronectin POSITIVE (A) NEGATIVE   Appearance, FETFIB CLEAR CLEAR  Urinalysis complete, with microscopic ([facility] only)  Result Value Ref Range   Color, Urine YELLOW (A) YELLOW   APPearance CLEAR (A) CLEAR   Glucose, UA NEGATIVE NEGATIVE mg/dL   Bilirubin Urine  NEGATIVE NEGATIVE   Ketones, ur 1+ (A) NEGATIVE mg/dL   Specific Gravity, Urine 1.009 1.005 - 1.030   Hgb urine dipstick 1+ (A) NEGATIVE   pH 6.0 5.0 - 8.0   Protein, ur NEGATIVE NEGATIVE mg/dL   Nitrite NEGATIVE NEGATIVE   Leukocytes, UA NEGATIVE NEGATIVE   RBC / HPF 0-5 0 - 5 RBC/hpf   WBC, UA 0-5 0 - 5 WBC/hpf   Bacteria, UA NONE SEEN NONE SEEN   Squamous Epithelial / LPF 0-5 (A) NONE SEEN   Mucous PRESENT   Type and screen [facility]  Result Value Ref Range   ABO/RH(D) A POS    Antibody Screen NEG    Sample Expiration 05/09/2015   ABO/Rh  Result Value Ref Range   ABO/RH(D) A POS     Assessment:  24 y.o. female 238w6d, Estimated Date of Delivery: 07/02/15 with:   Preterm labor, arrested.    Plan:   Continue Procardia For second dose of antenatal steroids this evening.  Wet prep performed as no other noted cause of preterm contractions/labor noted thus far.  Dispo: If all workup negative, and patient's cervix remains unchanged, can likely d/c home with Procardia and place on activity restrictions from job/pelvic rest until 36 weeks.    Hildred LaserAnika Angelyna Henderson, MD [facility]

## 2015-05-08 LAB — CULTURE, BETA STREP (GROUP B ONLY)

## 2015-05-09 ENCOUNTER — Ambulatory Visit (INDEPENDENT_AMBULATORY_CARE_PROVIDER_SITE_OTHER): Payer: BLUE CROSS/BLUE SHIELD | Admitting: Obstetrics and Gynecology

## 2015-05-09 VITALS — BP 101/65 | HR 79 | Wt 149.1 lb

## 2015-05-09 DIAGNOSIS — O4703 False labor before 37 completed weeks of gestation, third trimester: Secondary | ICD-10-CM

## 2015-05-09 DIAGNOSIS — Z3483 Encounter for supervision of other normal pregnancy, third trimester: Secondary | ICD-10-CM

## 2015-05-09 DIAGNOSIS — Z3493 Encounter for supervision of normal pregnancy, unspecified, third trimester: Secondary | ICD-10-CM

## 2015-05-09 LAB — POCT URINALYSIS DIPSTICK
BILIRUBIN UA: NEGATIVE
Glucose, UA: NEGATIVE
KETONES UA: NEGATIVE
Nitrite, UA: NEGATIVE
PH UA: 7.5
Protein, UA: NEGATIVE
Spec Grav, UA: 1.01
Urobilinogen, UA: NEGATIVE

## 2015-05-09 NOTE — Patient Instructions (Addendum)
Patient needs to begin weaning down on Celexa.  Start cutting down to half a tablet per day.

## 2015-05-09 NOTE — Progress Notes (Signed)
ROB: Patient following up from recent hospitalization last week for preterm labor.  Received antenatal steroids for + FFN.  Cervix at that time was 2.5 cm.  Still notes ctx and increased pressure.  Is taking Procardia as prescribed.  Exam today unchanged on speculum exam.  Continue pelvic rest/bed rest. RTC in 2 weeks.

## 2015-05-14 NOTE — Discharge Summary (Signed)
OB Discharge Summary     Patient Name: Savannah AuerLora E Stuck DOB: 02/26/1991 MRN: [id number]  Date of admission: 05/05/2015 Delivering MD: This patient has no babies on file.  Date of discharge: 04/16/2015  Admitting diagnosis: 31 weeks - contractions Intrauterine pregnancy: 4040w6d     Secondary diagnosis:  Active Problems:   Preterm contractions  Additional problems: + FFN     Discharge diagnosis: Preterm contractions                                                                                                Complications: None  Hospital course:  The patient was admitted to Labor and Delivery for observation with preterm contractions, + FFN, and cervical dilation at 31.[redacted] weeks gestation (2 cm).  She was administered a full course of antenatal steroids. She received 2 doses of terbutaline with continued contractions, and then was initiated on a course of Procardia.  No further cervical change was observed.  The patient was discharged home the following day.   Physical exam  Filed Vitals:   05/06/15 1230 05/06/15 1630 05/06/15 1756 05/06/15 2017  BP: 97/58 103/70 103/70 116/63  Pulse: 91 68  89  Temp: 98.2 F (36.8 C) 98.5 F (36.9 C)  98.2 F (36.8 C)  TempSrc: Oral Oral  Oral  Resp: 16 16  20   Height:      Weight:      SpO2: 99% 99%  100%   General: alert CVS: regular rate and rhythm Pulm: CTAB Uterine Fundus: gravid, soft, palpable contractions Pelvis: cervix 2/40%/-3/cephalic.  Scant vaginal discharge.  DVT Evaluation: No evidence of DVT seen on physical exam.  Labs: Results for orders placed or performed during the hospital encounter of 05/05/15  Culture, beta strep (group b only)  Result Value Ref Range   Specimen Description VAGINAL/RECTAL    Special Requests NONE    Culture NO BETA HEMOLYTIC STREPTOCOCCI ISOLATED    Report Status 05/08/2015 FINAL   Chlamydia/NGC rt PCR ([facility] only)  Result Value Ref Range   Specimen source GC/Chlam URINE, RANDOM    Chlamydia  Tr NOT DETECTED NOT DETECTED   N gonorrhoeae NOT DETECTED NOT DETECTED  Wet prep, genital  Result Value Ref Range   Yeast Wet Prep HPF POC NONE SEEN NONE SEEN   Trich, Wet Prep NONE SEEN NONE SEEN   Clue Cells Wet Prep HPF POC RARE (A) NONE SEEN   WBC, Wet Prep HPF POC MODERATE (A) NONE SEEN   Sperm NONE SEEN   Fetal fibronectin  Result Value Ref Range   Fetal Fibronectin POSITIVE (A) NEGATIVE   Appearance, FETFIB CLEAR CLEAR  Urinalysis complete, with microscopic ([facility] only)  Result Value Ref Range   Color, Urine YELLOW (A) YELLOW   APPearance CLEAR (A) CLEAR   Glucose, UA NEGATIVE NEGATIVE mg/dL   Bilirubin Urine NEGATIVE NEGATIVE   Ketones, ur 1+ (A) NEGATIVE mg/dL   Specific Gravity, Urine 1.009 1.005 - 1.030   Hgb urine dipstick 1+ (A) NEGATIVE   pH 6.0 5.0 - 8.0   Protein, ur NEGATIVE NEGATIVE mg/dL   Nitrite  NEGATIVE NEGATIVE   Leukocytes, UA NEGATIVE NEGATIVE   RBC / HPF 0-5 0 - 5 RBC/hpf   WBC, UA 0-5 0 - 5 WBC/hpf   Bacteria, UA NONE SEEN NONE SEEN   Squamous Epithelial / LPF 0-5 (A) NONE SEEN   Mucous PRESENT   Type and screen [facility]  Result Value Ref Range   ABO/RH(D) A POS    Antibody Screen NEG    Sample Expiration 05/09/2015   ABO/Rh  Result Value Ref Range   ABO/RH(D) A POS     Discharge instruction: per After Visit Summary.  Pelvic rest/modified bed resxt x 3-4 weeks.  After visit meds:    Medication List    TAKE these medications        citalopram 40 MG tablet  Commonly known as:  CELEXA  Take 1 tablet by mouth.     multivitamin-prenatal 27-0.8 MG Tabs tablet  Take 1 tablet by mouth daily at 12 noon.     NIFEdipine 10 MG capsule  Commonly known as:  PROCARDIA  Take 1 capsule (10 mg total) by mouth every 6 (six) hours.        Diet: routine diet  Activity: Bed rest  4 weeks. Pelvic rest for 5 weeks.   Outpatient follow up:1 week Follow up Appt:Future Appointments Date Time Provider Department Center   05/24/2015 10:00 AM Hildred Laser, MD [facility] None   Follow up Visit:No Follow-up on file.   05/14/2015 Hildred Laser, MD

## 2015-05-16 ENCOUNTER — Telehealth: Payer: Self-pay | Admitting: Obstetrics and Gynecology

## 2015-05-16 NOTE — Telephone Encounter (Signed)
PT CALLED AND WANTED TO KNOW IF SOMETHING COULD BE CALLED IN FOR HER TO HELP SLEEP, SHE STATED HER LEGS ARE REALLY RESTLESS AND SHE CANT SLEEP.

## 2015-05-16 NOTE — Telephone Encounter (Signed)
Called pt advised her that she may take Tylenol PM, Benadryl, or Unisom for sleep. Advised pt to be aware this will also make baby drowsy and she may feel less fetal movement at that time. Advised pt that for restless legs she make take magnesium oxide 600mg .

## 2015-05-17 ENCOUNTER — Telehealth: Payer: Self-pay | Admitting: Obstetrics and Gynecology

## 2015-05-17 NOTE — Telephone Encounter (Signed)
Pt came by office and states that she needs a note clarifying that she should be out of work until the delivery of her baby. Informed pt that per the letter that Dr.Cherry wrote (see in letters) pt is only to be out of work for 3wks, until [redacted]wks gestation and then pt is to return to work. Pt gave verbal understanding.

## 2015-05-17 NOTE — Telephone Encounter (Signed)
Patient needs a note stating the exact date she was taken out of work and that she is to remain out until she delivers. The patient states she got a note from Dr Valentino Saxonherry but it was not clear enough to her [employer]. She would like to pick it up today before we close if possible.Thanks

## 2015-05-19 ENCOUNTER — Telehealth: Payer: Self-pay

## 2015-05-19 NOTE — Telephone Encounter (Signed)
OB call transferred from front desk- 33 5/7 Having ctx 7 in about 45 min. Lasting 3 hours. Not painful, but having a lot of pressure. NO vb. Pos fm. Slight n/v. NO diarrhea. NO lof. NO uti sx. Pt is on bed rest. S/p procardia and steroids for pos ffn. Visit 1 week ago cervix was unchanged.  [facility] informed she will have ctx the remainder of her pregnancy. Unless they are 5 minutes apart for several hours and painful she is to monitor. If leaking fluid, vb, or decreased fm are noticed, then she needs to contact office asap. Pt is advised to stay in bed.

## 2015-05-24 ENCOUNTER — Ambulatory Visit (INDEPENDENT_AMBULATORY_CARE_PROVIDER_SITE_OTHER): Payer: BLUE CROSS/BLUE SHIELD | Admitting: Obstetrics and Gynecology

## 2015-05-24 ENCOUNTER — Encounter: Payer: Self-pay | Admitting: Obstetrics and Gynecology

## 2015-05-24 VITALS — BP 104/64 | HR 79 | Wt 148.6 lb

## 2015-05-24 DIAGNOSIS — O99013 Anemia complicating pregnancy, third trimester: Secondary | ICD-10-CM

## 2015-05-24 DIAGNOSIS — O4703 False labor before 37 completed weeks of gestation, third trimester: Secondary | ICD-10-CM

## 2015-05-24 DIAGNOSIS — Z3493 Encounter for supervision of normal pregnancy, unspecified, third trimester: Secondary | ICD-10-CM

## 2015-05-24 LAB — POCT URINALYSIS DIPSTICK
BILIRUBIN UA: NEGATIVE
Glucose, UA: NEGATIVE
KETONES UA: NEGATIVE
Nitrite, UA: NEGATIVE
PH UA: 6.5
SPEC GRAV UA: 1.025
Urobilinogen, UA: NEGATIVE

## 2015-05-24 MED ORDER — DOXYLAMINE SUCCINATE (SLEEP) 25 MG PO TABS: 25.0000 mg | ORAL_TABLET | Freq: Every day | ORAL | Status: DC

## 2015-05-24 NOTE — Progress Notes (Signed)
ROB: Patient notes stronger contractions overnight, however have spaced back out.  Cervical exam unchanged.  Continue bedrest until 37 weeks.  Notes that she needs a second letter from her job.  RTC in 2 weeks. To repeat 36 week labs at that time.

## 2015-06-07 ENCOUNTER — Encounter: Payer: Self-pay | Admitting: Anesthesiology

## 2015-06-07 ENCOUNTER — Inpatient Hospital Stay
Admission: EM | Admit: 2015-06-07 | Discharge: 2015-06-09 | DRG: 775 | Disposition: A | Discharge status: Home / Self Care | Payer: BLUE CROSS/BLUE SHIELD | Attending: Obstetrics and Gynecology | Admitting: Obstetrics and Gynecology

## 2015-06-07 ENCOUNTER — Encounter: Payer: BLUE CROSS/BLUE SHIELD | Admitting: Obstetrics and Gynecology

## 2015-06-07 DIAGNOSIS — Z23 Encounter for immunization: Secondary | ICD-10-CM | POA: Diagnosis not present

## 2015-06-07 DIAGNOSIS — Z87891 Personal history of nicotine dependence: Secondary | ICD-10-CM

## 2015-06-07 DIAGNOSIS — Z3A36 36 weeks gestation of pregnancy: Secondary | ICD-10-CM

## 2015-06-07 DIAGNOSIS — O42913 Preterm premature rupture of membranes, unspecified as to length of time between rupture and onset of labor, third trimester: Secondary | ICD-10-CM | POA: Diagnosis present

## 2015-06-07 DIAGNOSIS — Z3483 Encounter for supervision of other normal pregnancy, third trimester: Secondary | ICD-10-CM

## 2015-06-07 DIAGNOSIS — O9902 Anemia complicating childbirth: Secondary | ICD-10-CM | POA: Diagnosis present

## 2015-06-07 DIAGNOSIS — D649 Anemia, unspecified: Secondary | ICD-10-CM | POA: Diagnosis present

## 2015-06-07 LAB — CBC
HEMATOCRIT: 28.8 % — AB (ref 35.0–47.0)
HEMOGLOBIN: 9.2 g/dL — AB (ref 12.0–16.0)
MCH: 26.6 pg (ref 26.0–34.0)
MCHC: 31.8 g/dL — AB (ref 32.0–36.0)
MCV: 83.5 fL (ref 80.0–100.0)
Platelets: 213 10*3/uL (ref 150–440)
RBC: 3.45 MIL/uL — ABNORMAL LOW (ref 3.80–5.20)
RDW: 14.4 % (ref 11.5–14.5)
WBC: 11.1 10*3/uL — ABNORMAL HIGH (ref 3.6–11.0)

## 2015-06-07 LAB — TYPE AND SCREEN
ABO/RH(D): A POS
Antibody Screen: NEGATIVE

## 2015-06-07 MED ORDER — BETAMETHASONE SOD PHOS & ACET 6 (3-3) MG/ML IJ SUSP
12.0000 mg | Freq: Once | INTRAMUSCULAR | Status: AC
  Administered 2015-06-07: 12 mg via INTRAMUSCULAR
  Filled 2015-06-07: qty 1

## 2015-06-07 MED ORDER — OXYCODONE-ACETAMINOPHEN 5-325 MG PO TABS: 1.0000 | ORAL_TABLET | ORAL | Status: DC | PRN

## 2015-06-07 MED ORDER — CITALOPRAM HYDROBROMIDE 20 MG PO TABS
40.0000 mg | ORAL_TABLET | Freq: Every day | ORAL | Status: DC
  Administered 2015-06-08 – 2015-06-09 (×2): 40 mg via ORAL
  Filled 2015-06-07 (×5): qty 2

## 2015-06-07 MED ORDER — IBUPROFEN 800 MG PO TABS
ORAL_TABLET | ORAL | Status: AC
  Administered 2015-06-07: 800 mg via ORAL
  Filled 2015-06-07: qty 1

## 2015-06-07 MED ORDER — ACETAMINOPHEN 325 MG PO TABS: 650.0000 mg | ORAL_TABLET | ORAL | Status: DC | PRN

## 2015-06-07 MED ORDER — FERROUS SULFATE 325 (65 FE) MG PO TABS
325.0000 mg | ORAL_TABLET | Freq: Two times a day (BID) | ORAL | Status: DC
  Administered 2015-06-07 – 2015-06-09 (×5): 325 mg via ORAL
  Filled 2015-06-07 (×6): qty 1

## 2015-06-07 MED ORDER — IBUPROFEN 800 MG PO TABS
800.0000 mg | ORAL_TABLET | Freq: Four times a day (QID) | ORAL | Status: DC
  Administered 2015-06-07 – 2015-06-09 (×8): 800 mg via ORAL
  Filled 2015-06-07 (×8): qty 1

## 2015-06-07 MED ORDER — BENZOCAINE-MENTHOL 20-0.5 % EX AERO: 1.0000 "application " | INHALATION_SPRAY | CUTANEOUS | Status: DC | PRN

## 2015-06-07 MED ORDER — AMPICILLIN SODIUM 2 G IJ SOLR
INTRAMUSCULAR | Status: AC
  Administered 2015-06-07: 2 g via INTRAVENOUS
  Filled 2015-06-07: qty 2000

## 2015-06-07 MED ORDER — ONDANSETRON HCL 4 MG PO TABS: 4.0000 mg | ORAL_TABLET | ORAL | Status: DC | PRN

## 2015-06-07 MED ORDER — AMPICILLIN SODIUM 2 G IJ SOLR
2.0000 g | Freq: Once | INTRAMUSCULAR | Status: AC
  Administered 2015-06-07: 2 g via INTRAVENOUS

## 2015-06-07 MED ORDER — LIDOCAINE HCL (PF) 1 % IJ SOLN
30.0000 mL | INTRAMUSCULAR | Status: DC | PRN
  Filled 2015-06-07: qty 30

## 2015-06-07 MED ORDER — LACTATED RINGERS IV SOLN: 500.0000 mL | INTRAVENOUS | Status: DC | PRN

## 2015-06-07 MED ORDER — ONDANSETRON HCL 4 MG/2ML IJ SOLN: 4.0000 mg | INTRAMUSCULAR | Status: DC | PRN

## 2015-06-07 MED ORDER — OXYTOCIN 10 UNIT/ML IJ SOLN
INTRAMUSCULAR | Status: AC
  Filled 2015-06-07: qty 2

## 2015-06-07 MED ORDER — DIPHENHYDRAMINE HCL 25 MG PO CAPS: 25.0000 mg | ORAL_CAPSULE | Freq: Four times a day (QID) | ORAL | Status: DC | PRN

## 2015-06-07 MED ORDER — LACTATED RINGERS IV SOLN: INTRAVENOUS | Status: DC

## 2015-06-07 MED ORDER — WITCH HAZEL-GLYCERIN EX PADS: 1.0000 "application " | MEDICATED_PAD | CUTANEOUS | Status: DC | PRN

## 2015-06-07 MED ORDER — OXYTOCIN BOLUS FROM INFUSION: 500.0000 mL | INTRAVENOUS | Status: DC

## 2015-06-07 MED ORDER — OXYTOCIN 40 UNITS IN LACTATED RINGERS INFUSION - SIMPLE MED
62.5000 mL/h | INTRAVENOUS | Status: DC
  Filled 2015-06-07: qty 1000

## 2015-06-07 MED ORDER — FENTANYL 2.5 MCG/ML W/ROPIVACAINE 0.2% IN NS 100 ML EPIDURAL INFUSION (ARMC-ANES)
EPIDURAL | Status: AC
  Filled 2015-06-07: qty 100

## 2015-06-07 MED ORDER — DIBUCAINE 1 % RE OINT: 1.0000 "application " | TOPICAL_OINTMENT | RECTAL | Status: DC | PRN

## 2015-06-07 MED ORDER — SIMETHICONE 80 MG PO CHEW: 80.0000 mg | CHEWABLE_TABLET | ORAL | Status: DC | PRN

## 2015-06-07 MED ORDER — ONDANSETRON HCL 4 MG/2ML IJ SOLN: 4.0000 mg | Freq: Four times a day (QID) | INTRAMUSCULAR | Status: DC | PRN

## 2015-06-07 MED ORDER — DOCUSATE SODIUM 100 MG PO CAPS
100.0000 mg | ORAL_CAPSULE | Freq: Two times a day (BID) | ORAL | Status: DC
  Administered 2015-06-07 – 2015-06-09 (×4): 100 mg via ORAL
  Filled 2015-06-07 (×4): qty 1

## 2015-06-07 MED ORDER — SODIUM CHLORIDE 0.9 % IV SOLN: 1.0000 g | INTRAVENOUS | Status: DC

## 2015-06-07 MED ORDER — ZOLPIDEM TARTRATE 5 MG PO TABS: 5.0000 mg | ORAL_TABLET | Freq: Every evening | ORAL | Status: DC | PRN

## 2015-06-07 MED ORDER — PRENATAL MULTIVITAMIN CH
1.0000 | ORAL_TABLET | Freq: Every day | ORAL | Status: DC
  Administered 2015-06-08 – 2015-06-09 (×2): 1 via ORAL
  Filled 2015-06-07 (×3): qty 1

## 2015-06-07 MED ORDER — LANOLIN HYDROUS EX OINT: TOPICAL_OINTMENT | CUTANEOUS | Status: DC | PRN

## 2015-06-07 MED ORDER — BUTORPHANOL TARTRATE 1 MG/ML IJ SOLN: 1.0000 mg | INTRAMUSCULAR | Status: DC | PRN

## 2015-06-07 MED ORDER — MEASLES, MUMPS & RUBELLA VAC ~~LOC~~ INJ
0.5000 mL | INJECTION | Freq: Once | SUBCUTANEOUS | Status: AC
  Administered 2015-06-09: 0.5 mL via SUBCUTANEOUS
  Filled 2015-06-07 (×3): qty 0.5

## 2015-06-07 MED ORDER — CITRIC ACID-SODIUM CITRATE 334-500 MG/5ML PO SOLN: 30.0000 mL | ORAL | Status: DC | PRN

## 2015-06-07 MED ORDER — MISOPROSTOL 200 MCG PO TABS
ORAL_TABLET | ORAL | Status: AC
  Filled 2015-06-07: qty 4

## 2015-06-07 MED ORDER — OXYCODONE-ACETAMINOPHEN 5-325 MG PO TABS
1.0000 | ORAL_TABLET | ORAL | Status: DC | PRN
  Administered 2015-06-07 – 2015-06-08 (×4): 1 via ORAL
  Filled 2015-06-07 (×4): qty 1

## 2015-06-07 MED ORDER — OXYCODONE-ACETAMINOPHEN 5-325 MG PO TABS: 2.0000 | ORAL_TABLET | ORAL | Status: DC | PRN

## 2015-06-07 MED ORDER — LIDOCAINE HCL (PF) 1 % IJ SOLN
INTRAMUSCULAR | Status: AC
  Filled 2015-06-07: qty 30

## 2015-06-07 MED ORDER — AMMONIA AROMATIC IN INHA
RESPIRATORY_TRACT | Status: AC
  Filled 2015-06-07: qty 10

## 2015-06-07 NOTE — H&P (Addendum)
Obstetric History and Physical  Savannah Richmond is a 24 y.o. G2P1001 with IUP at 62w3dpresenting for PPROM. Patient states she has been having  irregular, every 7-10 minutes contractions, none vaginal bleeding, ruptured, clear fluid membranes (since 0100), with active fetal movement.    Prenatal Course Source of Care: [facility] with onset of care at 12 weeks Pregnancy complications or risks: Patient Active Problem List   Diagnosis Date Noted  . Labor and delivery, indication for care 06/07/2015  . Preterm labor in third trimester without delivery 05/05/2015  . Anemia of pregnancy in third trimester 04/25/2015  . Supervision of normal IUP (intrauterine pregnancy) in multigravida 02/28/2015  . Rubella non-immune status, antepartum 12/21/2014  . History of irregular menstrual bleeding 11/28/2014  . Anxiety and depression 03/09/2014   She plans to breastfeed She desires Nexplanon for postpartum contraception.   Prenatal labs and studies: ABO, Rh: --/--/A POS (11/26 0539) Antibody: NEG (11/26 0538) Rubella: <20.0 (06/22 1625) RPR: Non Reactive (06/22 1625)  HBsAg: Negative (06/22 1625)  HIV: Non Reactive (06/22 1625)  GBS:  Negative (11/25 1956) 1 hr Glucola  normal Genetic screening normal Anatomy UKoreanormal  Prenatal Transfer Tool  Maternal Diabetes: No Genetic Screening: Normal Maternal Ultrasounds/Referrals: Normal Fetal Ultrasounds or other Referrals:  None Maternal Substance Abuse:  No Significant Maternal Medications:  Meds include: Other: Celexa Significant Maternal Lab Results: Lab values include: Other:  Rubella non-immune  Past Medical History  Diagnosis Date  . History of irregular menstrual bleeding   . Anxiety and depression 03/09/2014    Past Surgical History  Procedure Laterality Date  . No past surgeries      OB History  Gravida Para Term Preterm AB SAB TAB Ectopic Multiple Living  _0 # Outcome Date GA Lbr Len/2nd Weight Sex  Delivery Anes PTL Lv  2 Current           1 Term 10/22/08 456w3d7 lb 15 oz (3.6 kg) F Vag-Spont  N Y    Obstetric Comments  ZIKA EXPOSURE SCREEN:    The patient has not traveled to a [place] endemic area within the past 6 months, nor has she had unprotected sex with a partner who has travelled to a [place] region within the past 6 months.  The patient has been advised to notify usKoreaf these factors change any time during this current pregnancy, so adequate testing and monitoring can be initiated.LoCindra Evesor NOB nurse interview visit. G-.   P-. Pregnancy eduction material explained and given. NOB labs ordered. HIV consent form signed. PNV encouraged. NT ordered/declined/to discuss with provider. Pt. To follow up with provider in __ weeks for NOB physical.  All questions answered.      Social History   Social History  . Marital Status: Single    Spouse Name: N/A  . Number of Children: N/A  . Years of Education: N/A   Occupational History  . 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 Smoking status: Former Smoker    Types: Cigarettes    Quit date: 11/07/2014  . Smokeless tobacco: Never Used  . Alcohol Use: No  . Drug Use: No  . Sexual Activity:    Partners: Male   Other Topics Concern  . None   Social History Narrative   WORKS WITH [employer]    Family History  Problem Relation Age  of Onset  . Cancer Maternal Grandmother 43    Started as breast cancer then mastasized-stage 4  . Cancer Maternal Grandfather     prostate cancer    Prescriptions prior to admission  Medication Sig Dispense Refill Last Dose  . citalopram (CELEXA) 40 MG tablet TAKE 1 TABLET (40 MG TOTAL) BY MOUTH ONCE DAILY.   Taking  . doxylamine, Sleep, (UNISOM) 25 MG tablet Take 1 tablet (25 mg total) by mouth at bedtime. 30 tablet 2   . Prenatal Vit-Fe Fumarate-FA (MULTIVITAMIN-PRENATAL) 27-0.8 MG TABS tablet Take 1 tablet by mouth daily at 12 noon.   Taking    No Known  Allergies  Review of Systems: Negative except for what is mentioned in HPI.  Physical Exam: BP 118/73 mmHg  Pulse 88  Temp(Src) 98.4 F (36.9 C) (Oral)  Resp 18  Ht _0  (1.6 m)  Wt 148 lb (67.132 kg)  BMI 26.22 kg/m2  LMP 09/09/2014 (Approximate) CONSTITUTIONAL: Well-developed, well-nourished female in no acute distress.  HENT:  Normocephalic, atraumatic, External right and left ear normal. Oropharynx is clear and moist EYES: Conjunctivae and EOM are normal. Pupils are equal, round, and reactive to light. No scleral icterus.  NECK: Normal range of motion, supple, no masses SKIN: Skin is warm and dry. No rash noted. Not diaphoretic. No erythema. No pallor. [place]: Alert and oriented to person, place, and time. Normal reflexes, muscle tone coordination. No cranial nerve deficit noted. PSYCHIATRIC: Normal mood and affect. Normal behavior. Normal judgment and thought content. CARDIOVASCULAR: Normal heart rate noted, regular rhythm RESPIRATORY: Effort and breath sounds normal, no problems with respiration noted ABDOMEN: Soft, nontender, nondistended, gravid. MUSCULOSKELETAL: Normal range of motion. No edema and no tenderness. 2+ distal pulses.  Cervical Exam: Dilatation 4cm   Effacement 60%   Station -3   Presentation: cephalic FHT:  Baseline rate 125 bpm   Variability moderate  Accelerations present. Decelerations none Contractions: Every 3-5 mins   Pertinent Labs/Studies:   Results for orders placed or performed during the hospital encounter of 06/07/15 (from the past 24 hour(s))  CBC     Status: Abnormal   Collection Time: 06/07/15  6:18 AM  Result Value Ref Range   WBC 11.1 (H) 3.6 - 11.0 K/uL   RBC 3.45 (L) 3.80 - 5.20 MIL/uL   Hemoglobin 9.2 (L) 12.0 - 16.0 g/dL   HCT 28.8 (L) 35.0 - 47.0 %   MCV 83.5 80.0 - 100.0 fL   MCH 26.6 26.0 - 34.0 pg   MCHC 31.8 (L) 32.0 - 36.0 g/dL   RDW 14.4 11.5 - 14.5 %   Platelets 213 150 - 440 K/uL    Assessment : Savannah Richmond is a  24 y.o. G2P1001 at 43w3dbeing admitted for PPROM and labor.  Mild anemia of pregnancy.   Plan: Labor: Expectant management.  Augmentation as needed, per protocol.  Monitor for s/s of chorioamnionitis. FWB: Reassuring fetal heart tracing.  Initially treated with loading of Ampicillin due to difficulty locating GBS result, however is GBS negative. Also with h/o arrested preterm labor at ~32 weeks, given course of antenatal steroids at that time.  Will give rescue dose today.  Delivery plan: Hopeful for vaginal delivery.  Will need MMR postpartum for Rubella non-immune status.    ARubie Maid MD [facility] 06/07/2015 7:49 AM

## 2015-06-07 NOTE — Anesthesia Preprocedure Evaluation (Deleted)
Anesthesia Evaluation  Patient identified by MRN, date of birth, ID band Patient awake    Reviewed: Allergy & Precautions, H&P , NPO status , Patient's Chart, lab work & pertinent test results, reviewed documented beta blocker date and time   Airway Mallampati: III  TM Distance: >3 FB Neck ROM: full    Dental no notable dental hx. (+) Teeth Intact   Pulmonary neg pulmonary ROS, former smoker,    Pulmonary exam normal breath sounds clear to auscultation       Cardiovascular Exercise Tolerance: Good negative cardio ROS Normal cardiovascular exam Rhythm:regular Rate:Normal     Neuro/Psych PSYCHIATRIC DISORDERS negative neurological ROS  negative psych ROS   GI/Hepatic negative GI ROS, Neg liver ROS,   Endo/Other  negative endocrine ROS  Renal/GU negative Renal ROS  negative genitourinary   Musculoskeletal   Abdominal   Peds  Hematology negative hematology ROS (+) anemia ,   Anesthesia Other Findings   Reproductive/Obstetrics (+) Pregnancy                             Anesthesia Physical Anesthesia Plan  ASA: II  Anesthesia Plan: Regional and Epidural   Post-op Pain Management:    Induction:   Airway Management Planned:   Additional Equipment:   Intra-op Plan:   Post-operative Plan:   Informed Consent: I have reviewed the patients History and Physical, chart, labs and discussed the procedure including the risks, benefits and alternatives for the proposed anesthesia with the patient or authorized representative who has indicated his/her understanding and acceptance.     Plan Discussed with: CRNA  Anesthesia Plan Comments:         Anesthesia Quick Evaluation

## 2015-06-07 NOTE — Consult Note (Signed)
Neonatology Note:   Attendance at Delivery:   I was called to LDR 3 just after NSVD of this 36 3/[redacted] week GA infant, due to apnea. The requesting provider was R. Harris for Dr. Cherry. The mother is a G2P1 A pos, GBS neg with SROM and onset of preterm labor this morning. The mother was given 1 dose of Betamethasone 3 hours before delivery and Ampicillin 4 hours prior to delivery. She had not received any narcotic medications. ROM 10 hours prior to delivery, fluid clear. At delivery, the baby was blue, apneic, and had a low HR. He was bulb suctioned and given stimulation, and PPV was just being started when I arrived at 2 minutes of life. I continued PPV, increasing the pressure and FIO2. His HR came up quickly and color improved. A pulse oximeter was placed, and O2 saturations rose steadily into the low 90s, at which time I began to wean the FIO2 to keep the saturations within desired parameters. The baby began to breathe on his own at about 5 minutes, but air exchange was decreased, even with CPAP, and breath sounds were crackly. I continued to give neopuff CPAP of 5 and was able to wean the FIO2 to 21% by about 11-12 minutes. The baby still had decreased tone and did not cry or struggle against the CPAP mask. He was seen briefly by his mother in the DR, then was transported to the NICU for further care. Ap 1/5/7.  Mashonda Broski C. Linnet Bottari, MD 

## 2015-06-07 NOTE — Plan of Care (Signed)
Pt up to void. V oided QS.  Pericare performed well per pt. [id number]  Pt report to next shift RN. Savannah Richmond RNC

## 2015-06-07 NOTE — Plan of Care (Signed)
MD on unit. States that she located record that patient is GBS negative and will not need treatment during her labor. Will inform patient. Ellison Carwin Taleigha Pinson RNC

## 2015-06-08 ENCOUNTER — Encounter: Payer: Self-pay | Admitting: Obstetrics and Gynecology

## 2015-06-08 LAB — CBC
HEMATOCRIT: 26.3 % — AB (ref 35.0–47.0)
Hemoglobin: 8.5 g/dL — ABNORMAL LOW (ref 12.0–16.0)
MCH: 26.7 pg (ref 26.0–34.0)
MCHC: 32.3 g/dL (ref 32.0–36.0)
MCV: 82.5 fL (ref 80.0–100.0)
Platelets: 208 10*3/uL (ref 150–440)
RBC: 3.18 MIL/uL — AB (ref 3.80–5.20)
RDW: 14.6 % — AB (ref 11.5–14.5)
WBC: 14.3 10*3/uL — AB (ref 3.6–11.0)

## 2015-06-08 LAB — SURGICAL PATHOLOGY

## 2015-06-08 LAB — RPR: RPR: NONREACTIVE

## 2015-06-08 NOTE — Progress Notes (Signed)
Post Partum Day #1 s/p SVD  Subjective: no complaints, up ad lib, voiding and tolerating PO  Objective: Temp:  [97.9 F (36.6 C)-98.6 F (37 C)] 98 F (36.7 C) (12/29 0753) Pulse Rate:  [62-131] 76 (12/29 0753) Resp:  [18-20] 18 (12/29 0753) BP: (98-143)/(48-94) 98/48 mmHg (12/29 0753) SpO2:  [98 %-100 %] 99 % (12/29 0753)  Physical Exam:  General: alert and no distress Lochia: appropriate Uterine Fundus: firm Incision: None DVT Evaluation: No evidence of DVT seen on physical exam. Negative Homan's sign. No cords or calf tenderness. No significant calf/ankle edema.   Recent Labs  06/07/15 0618 06/08/15 0441  HGB 9.2* 8.5*  HCT 28.8* 26.3*    Assessment/Plan: Plan for discharge tomorrow, Breastfeeding, Circumcision prior to discharge and Contraception Nexplanon  Moderate anemia, asymptomatic.  Continue with PO iron supplementation.    LOS: 1 day   Hildred Lasernika Tavia Stave 06/08/2015, 8:18 AM

## 2015-06-09 MED ORDER — DOCUSATE SODIUM 100 MG PO CAPS: 100.0000 mg | ORAL_CAPSULE | Freq: Two times a day (BID) | ORAL | Status: DC

## 2015-06-09 MED ORDER — FERROUS SULFATE 325 (65 FE) MG PO TABS: 325.0000 mg | ORAL_TABLET | Freq: Two times a day (BID) | ORAL | Status: DC

## 2015-06-09 MED ORDER — IBUPROFEN 800 MG PO TABS: 800.0000 mg | ORAL_TABLET | Freq: Four times a day (QID) | ORAL | Status: DC

## 2015-06-09 NOTE — Discharge Summary (Signed)
Obstetric Discharge Summary Reason for Admission: rupture of membranes (PPROM at 36 weeks) with onset of labor Prenatal Procedures: antenatal steroids for arrested preterm labor at [redacted] weeks gestation Intrapartum Procedures: spontaneous vaginal delivery Postpartum Procedures: Rubella Ig Complications-Operative and Postpartum: none   HEMOGLOBIN  Date Value Ref Range Status  06/08/2015 8.5* 12.0 - 16.0 g/dL Final   HCT  Date Value Ref Range Status  06/08/2015 26.3* 35.0 - 47.0 % Final   HEMATOCRIT  Date Value Ref Range Status  04/11/2015 28.8* 34.0 - 46.6 % Final    Physical Exam:  General: alert and no distress Lochia: appropriate Uterine Fundus: firm Incision: None DVT Evaluation: No evidence of DVT seen on physical exam. Negative Homan's sign. No cords or calf tenderness.  Discharge Diagnoses: Premature labor and PPROM, Anemia of pregnancy (asymptomatic)  Discharge Information: Date: 06/09/2015 Activity: pelvic rest x 6 weeks Diet: routine Medications: PNV, Ibuprofen, Colace, Iron and Celexa Condition: stable Instructions: refer to practice specific booklet Discharge to: home Follow-up Information    Follow up with Hildred LaserAnika Jomo Forand, MD In 6 weeks.   Specialties:  Obstetrics and Gynecology, Radiology   Why:  Postpartum visit   Contact information:   [address] [place] [id number] [phone]       Newborn Data: Live born female  Birth Weight: 6 lb 2.8 oz (2800 g) APGAR: 1, 5, 7  To remain in NICU  Hildred Lasernika Tariana Moldovan 06/09/2015, 1:16 PM

## 2015-06-09 NOTE — Progress Notes (Signed)
MD states pt received TDaP and Influenza vaccines during pregnacy. Reynold BowenSusan Paisley Kree Rafter, RN 06/09/2015 1:32 PM

## 2015-06-09 NOTE — Progress Notes (Signed)
Post Partum Day #2 s/p SVD  Subjective: no complaints, up ad lib, voiding and tolerating PO  Objective: Blood pressure 116/69, pulse 62, temperature 97.8 F (36.6 C), temperature source Oral, resp. rate 20, height 5\' 3"  (1.6 m), weight 148 lb (67.132 kg), last menstrual period 09/09/2014, SpO2 98 %, unknown if currently breastfeeding.  Physical Exam:  General: alert and no distress Lochia: appropriate Uterine Fundus: firm Incision: None DVT Evaluation: No evidence of DVT seen on physical exam. Negative Homan's sign. No cords or calf tenderness. No significant calf/ankle edema.   Recent Labs  06/07/15 0618 06/08/15 0441  HGB 9.2* 8.5*  HCT 28.8* 26.3*    Assessment/Plan: Discharge home, Breastfeeding, Circumcision prior to discharge and Contraception Nexplanon  Moderate anemia, asymptomatic.  Continue with PO iron supplementation.    LOS: 2 days   Hildred Lasernika Destaney Sarkis 06/09/2015, 1:20 PM

## 2015-06-09 NOTE — Progress Notes (Signed)
Discharge instructions provided.  Pt verbalizes understanding of all instructions and follow-up care.  Prescriptions given.  Pt discharged to home at 1845 on 06/09/15 via wheelchair by RN. Reynold BowenSusan Paisley Tanazia Achee, RN 06/09/2015 7:11 PM

## 2015-06-09 NOTE — Discharge Instructions (Signed)
General Postpartum Discharge Instructions ° °Do not drink alcohol or take tranquilizers.  °Do not take medicine that has not been prescribed by your doctor.  °Take showers instead of baths until your doctor gives you permission to take baths.  °No sexual intercourse or placement of anything in the vagina for 6 weeks or as instructed by your doctor. °Only take prescription or over-the-counter medicines  for pain, discomfort, or fever as directed by your doctor. Take medicines (antibiotics) that kill germs if they are prescribed for you. °  °Call the office or go to the Emergency Room if:  °You feel sick to your stomach (nauseous).  °You start to throw up (vomit).  °You have trouble eating or drinking.  °You have an oral temperature above 101.  °You have constipation that is not helped by adjusting diet or increasing fluid intake. Pain medicines are a common cause of constipation.  °You have foul smelling vaginal discharge or odor.  °You have bleeding requiring changing more than 1 pad per hour. °You have any other concerns. ° °SEEK IMMEDIATE MEDICAL CARE IF:  °You have persistent dizziness.  °You have difficulty breathing or shortness of breath.  °You have an oral temperature above 102.5, not controlled by medicine.  ° ° °Call your doctor for increased pain or vaginal bleeding, temperature above 100.4, depression, or concerns.  No strenuous activity or heavy lifting for 6 weeks.  No intercourse, tampons, douching, or enemas for 6 weeks.  No tub baths-showers only.  No driving for 2 weeks or while taking pain medications.  Continue prenatal vitamin and iron.   °

## 2015-06-09 NOTE — Clinical Social Work Note (Signed)
CSW visited with patient this morning and [facility] Patient had her 26 year old sitting next to her and patient reports she is doing well. Patient states that she is stable with her depression history and that she continues taking her celexa as prescribed. CSW signing off at this time. York SpanielMonica Felicia Bloomquist MSW,LCSW [phone]

## 2015-06-16 ENCOUNTER — Encounter: Payer: BLUE CROSS/BLUE SHIELD | Admitting: Obstetrics and Gynecology

## 2015-06-22 ENCOUNTER — Encounter: Payer: BLUE CROSS/BLUE SHIELD | Admitting: Obstetrics and Gynecology

## 2015-06-28 ENCOUNTER — Encounter: Payer: BLUE CROSS/BLUE SHIELD | Admitting: Obstetrics and Gynecology

## 2015-07-05 ENCOUNTER — Encounter: Payer: BLUE CROSS/BLUE SHIELD | Admitting: Obstetrics and Gynecology

## 2015-07-19 ENCOUNTER — Encounter: Payer: Self-pay | Admitting: Obstetrics and Gynecology

## 2015-07-19 ENCOUNTER — Ambulatory Visit (INDEPENDENT_AMBULATORY_CARE_PROVIDER_SITE_OTHER): Payer: BLUE CROSS/BLUE SHIELD | Admitting: Obstetrics and Gynecology

## 2015-07-19 VITALS — BP 115/76 | HR 70 | Ht 64.0 in | Wt 136.6 lb

## 2015-07-19 DIAGNOSIS — F329 Major depressive disorder, single episode, unspecified: Secondary | ICD-10-CM

## 2015-07-19 DIAGNOSIS — Z2839 Other underimmunization status: Secondary | ICD-10-CM

## 2015-07-19 DIAGNOSIS — O99019 Anemia complicating pregnancy, unspecified trimester: Secondary | ICD-10-CM

## 2015-07-19 DIAGNOSIS — F32A Depression, unspecified: Secondary | ICD-10-CM

## 2015-07-19 DIAGNOSIS — O9989 Other specified diseases and conditions complicating pregnancy, childbirth and the puerperium: Secondary | ICD-10-CM

## 2015-07-19 DIAGNOSIS — Z283 Underimmunization status: Secondary | ICD-10-CM

## 2015-07-19 NOTE — Assessment & Plan Note (Signed)
Received MMR postpartum, 06/09/2015.

## 2015-07-19 NOTE — Progress Notes (Signed)
Subjective:     Savannah Richmond is a 25 y.o. female who presents for a postpartum visit. She is 6 weeks postpartum following a spontaneous vaginal delivery. I have fully reviewed the prenatal and intrapartum course. The pregnancy was complicated by depression. The delivery was at 36 gestational weeks.  Anesthesia: epidural. Postpartum course has been well. Baby's course was complicated by respiratory depression and poor feeding (hospitalized x ~ 4 weeks). Baby is feeding by bottle. Bleeding has resumed (menses ~ 3 weeks ago). . Bowel function is normal. Bladder function is normal. Patient is not sexually active. Contraception method is Nexplanon. Postpartum depression screening: negative.  Currently on Celexa 40 mg daily.   The following portions of the patient's history were reviewed and updated as appropriate: allergies, current medications, past family history, past medical history, past social history, past surgical history and problem list.  Review of Systems Pertinent items noted in HPI and remainder of comprehensive ROS otherwise negative.   Objective:    BP 115/76 mmHg  Pulse 70  Ht  (1.626 m)  Wt 136 lb 9.6 oz (61.961 kg)  BMI 23.44 kg/m2  LMP 07/05/2015  Breastfeeding? No  General:  alert   Breasts:  inspection negative, no nipple discharge or bleeding, no masses or nodularity palpable  Lungs: clear to auscultation bilaterally  Heart:  regular rate and rhythm, S1, S2 normal, no murmur, click, rub or gallop  Abdomen: soft, non-tender; bowel sounds normal; no masses,  no organomegaly   Vulva:  normal  Vagina: normal vagina, no discharge, exudate, lesion, or erythema  Cervix:  multiparous appearance, no cervical motion tenderness and no lesions  Corpus: normal size, contour, position, consistency, mobility, non-tender  Adnexa:  normal adnexa and no mass, fullness, tenderness  Rectal Exam: Not performed.         Labs:  Lab Results  Component Value Date   WBC 14.3* 06/08/2015    HGB 8.5* 06/08/2015   HCT 26.3* 06/08/2015   MCV 82.5 06/08/2015   PLT 208 06/08/2015    Assessment:    Routine postpartum exam. Pap smear not done at today's visit.   H/o depression Anemia of pregnancy  Plan:   1. Contraception: Nexplanon.  Will schedule to return in 1-2 weeks for placement.  2.  Last pap smear in 2015, normal.  Up to date.  3.  H/o depression during pregnancy, on Celexa during pregnancy and postpartum, doing well.   4. Anemia of pregnancy - currently taking iron BID.  Will reassess levels today.  5. Follow up in: 2 weeks for Nexplanon as needed.     Hildred Laser, MD [facility]

## 2015-07-20 LAB — HEMOGLOBIN AND HEMATOCRIT, BLOOD
Hematocrit: 37.3 % (ref 34.0–46.6)
Hemoglobin: 12.3 g/dL (ref 11.1–15.9)

## 2015-07-21 ENCOUNTER — Telehealth: Payer: Self-pay

## 2015-07-21 NOTE — Telephone Encounter (Signed)
Called pt informed her of normal Hgb and the need to discontinue iron.

## 2015-07-21 NOTE — Telephone Encounter (Signed)
-----   Message from Hildred Laser, MD sent at 07/20/2015  2:42 PM EST ----- Please inform patient that Hgb levels are back to normal, can discontinue iron supplements.

## 2015-08-03 ENCOUNTER — Ambulatory Visit (INDEPENDENT_AMBULATORY_CARE_PROVIDER_SITE_OTHER): Payer: BLUE CROSS/BLUE SHIELD | Admitting: Obstetrics and Gynecology

## 2015-08-03 ENCOUNTER — Encounter: Payer: Self-pay | Admitting: Obstetrics and Gynecology

## 2015-08-03 VITALS — BP 114/66 | HR 80 | Ht 64.0 in | Wt 134.7 lb

## 2015-08-03 DIAGNOSIS — Z30017 Encounter for initial prescription of implantable subdermal contraceptive: Secondary | ICD-10-CM | POA: Diagnosis not present

## 2015-08-03 NOTE — Progress Notes (Signed)
    GYNECOLOGY CLINIC PROCEDURE NOTE  Savannah Richmond is a 25 y.o. [id number] here for Nexplanon insertion.  Last pap smear was on 02/2014 and was normal.  No other gynecologic concerns.  Nexplanon Insertion Procedure Patient identified, informed consent performed, consent signed.   Patient does understand that irregular bleeding is a very common side effect of this medication. She was advised to have backup contraception for one week after placement. Pregnancy test not performed today as patient had cessation of menses 3 days ago.  Appropriate time out taken.  Patient's right arm was prepped and draped in the usual sterile fashion. The ruler used to measure and mark insertion area.  Patient was prepped with alcohol swab and then injected with 3 ml of 1% lidocaine.  She was prepped with betadine, Nexplanon removed from packaging,  Device confirmed in needle, then inserted full length of needle and withdrawn per handbook instructions. Nexplanon was able to palpated in the patient's arm; patient palpated the insert herself. There was minimal blood loss.  Patient insertion site covered with guaze and a pressure bandage to reduce any bruising.  The patient tolerated the procedure well and was given post procedure instructions.     Hildred Laser, MD [facility]

## 2015-10-07 ENCOUNTER — Other Ambulatory Visit: Payer: Self-pay | Admitting: Obstetrics and Gynecology

## 2015-10-31 ENCOUNTER — Encounter: Payer: BLUE CROSS/BLUE SHIELD | Admitting: Obstetrics and Gynecology

## 2015-11-07 ENCOUNTER — Encounter: Payer: Self-pay | Admitting: Obstetrics and Gynecology

## 2015-11-07 ENCOUNTER — Ambulatory Visit (INDEPENDENT_AMBULATORY_CARE_PROVIDER_SITE_OTHER): Payer: BLUE CROSS/BLUE SHIELD | Admitting: Obstetrics and Gynecology

## 2015-11-07 VITALS — BP 103/65 | HR 80 | Ht 63.0 in | Wt 123.3 lb

## 2015-11-07 DIAGNOSIS — Z975 Presence of (intrauterine) contraceptive device: Secondary | ICD-10-CM | POA: Diagnosis not present

## 2015-11-07 DIAGNOSIS — Z716 Tobacco abuse counseling: Secondary | ICD-10-CM

## 2015-11-07 DIAGNOSIS — N921 Excessive and frequent menstruation with irregular cycle: Secondary | ICD-10-CM

## 2015-11-07 DIAGNOSIS — Z01419 Encounter for gynecological examination (general) (routine) without abnormal findings: Secondary | ICD-10-CM

## 2015-11-07 DIAGNOSIS — Z720 Tobacco use: Secondary | ICD-10-CM | POA: Diagnosis not present

## 2015-11-07 NOTE — Progress Notes (Signed)
GYNECOLOGY ANNUAL PHYSICAL EXAM PROGRESS NOTE  Subjective:    Savannah Richmond is a 25 y.o. [id number] female who presents for an annual exam. The patient has no complaints today. The patient is sexually active. The patient wears seatbelts: yes. The patient participates in regular exercise: no. Has the patient ever been transfused or tattooed?: yes (tattoo). The patient reports that there is not domestic violence in her life.    Gynecologic History Patient's last menstrual period was 10/24/2015. Menarche age: [age] Contraception: Nexplanon History of STI's: Denies [facility] Results were: normal.  Denies h/o abnormal pap smears.    Obstetric History   G2   P2   T1   P1   A0   TAB0   SAB0   E0   M0   L2     # Outcome Date GA Lbr Len/2nd Weight Sex Delivery Anes PTL Lv  2 Preterm 06/07/15 1910w3d / 00:16 6 lb 2.8 oz (2.8 kg) M Vag-Spont None  Y     Name: Jabier MuttonWINN,BOY Leonora     Apgar1:  1                Apgar5: 5  1 Term 10/22/08 2257w3d  7 lb 15 oz (3.6 kg) F Vag-Spont  N Y      Past Medical History  Diagnosis Date  . History of irregular menstrual bleeding   . Anxiety and depression 03/09/2014    Past Surgical History  Procedure Laterality Date  . No past surgeries      Family History  Problem Relation Age of Onset  . Cancer Maternal Grandmother [id number]    Started as breast cancer then mastasized-stage 4  . Cancer Maternal Grandfather     prostate cancer    Social History   Social History  . Marital Status: Single    Spouse Name: N/A  . Number of Children: N/A  . Years of Education: N/A   Occupational History  . 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 Smoking status: Current Every Day Smoker -- 0.25 packs/day    Types: Cigarettes  . Smokeless tobacco: Never Used  . Alcohol Use: No  . Drug Use: No  . Sexual Activity:    Partners: Male    Birth Control/ Protection: Implant   Other Topics Concern  . Not on file   Social  History Narrative   WORKS WITH [employer]    Current Outpatient Prescriptions on File Prior to Visit  Medication Sig Dispense Refill  . etonogestrel (NEXPLANON) 68 MG IMPL implant 1 each by Subdermal route once.     No current facility-administered medications on file prior to visit.    No Known Allergies   Review of Systems Constitutional: negative for chills, fatigue, fevers and sweats Eyes: negative for irritation, redness and visual disturbance Ears, nose, mouth, throat, and face: negative for hearing loss, nasal congestion, snoring and tinnitus Respiratory: negative for asthma, cough, sputum Cardiovascular: negative for chest pain, dyspnea, exertional chest pressure/discomfort, irregular heart beat, palpitations and syncope Gastrointestinal: negative for abdominal pain, change in bowel habits, nausea and vomiting Genitourinary: positive for irregular menstrual periods (with Nexplanon), genital lesions, sexual problems and vaginal discharge, dysuria and urinary incontinence Integument/breast: negative for breast lump, breast tenderness and nipple discharge Hematologic/lymphatic: negative for bleeding and easy bruising Musculoskeletal:negative for back pain and muscle weakness Neurological: negative for dizziness, headaches, vertigo and weakness Endocrine: negative for diabetic symptoms including polydipsia,  polyuria and skin dryness Allergic/Immunologic: negative for hay fever and urticaria      Objective:  Blood pressure 103/65, pulse 80, height  (1.6 m), weight 123 lb 4.8 oz (55.929 kg), last menstrual period 10/24/2015, not currently breastfeeding. Body mass index is 21.85 kg/(m^2).  General Appearance:    Alert, cooperative, no distress, appears stated age  Head:    Normocephalic, without obvious abnormality, atraumatic  Eyes:    PERRL, conjunctiva/corneas clear, EOM's intact, both eyes  Ears:    Normal external ear canals, both ears  Nose:   Nares normal,  septum midline, mucosa normal, no drainage or sinus tenderness  Throat:   Lips, mucosa, and tongue normal; teeth and gums normal  Neck:   Supple, symmetrical, trachea midline, no adenopathy; thyroid: no enlargement/tenderness/nodules; no carotid bruit or JVD  Back:     Symmetric, no curvature, ROM normal, no CVA tenderness  Lungs:     Clear to auscultation bilaterally, respirations unlabored  Chest Wall:    No tenderness or deformity   Heart:    Regular rate and rhythm, S1 and S2 normal, no murmur, rub or gallop  Breast Exam:    No tenderness, masses, or nipple abnormality  Abdomen:     Soft, non-tender, bowel sounds active all four quadrants, no masses, no organomegaly.    Genitalia:    Pelvic:external genitalia normal, vagina without lesions, or tenderness, rectovaginal septum normal. Scant brown blood noted in vaginal vault. Cervix normal in appearance, no cervical motion tenderness, no adnexal masses or tenderness.  Uterus normal size, shape, mobile, regular contours, nontender.  Rectal:    Normal external sphincter.  No hemorrhoids appreciated. Internal exam not done.   Extremities:   Extremities normal, atraumatic, no cyanosis or edema  Pulses:   2+ and symmetric all extremities  Skin:   Skin color, texture, turgor normal, no rashes or lesions.  Several small moles present across trunk and breasts.   Lymph nodes:   Cervical, supraclavicular, and axillary nodes normal  Neurologic:   CNII-XII intact, normal strength, sensation and reflexes throughout   .  Labs:  Lab Results  Component Value Date   WBC 14.3* 06/08/2015   HGB 8.5* 06/08/2015   HCT 37.3 07/19/2015   MCV 82.5 06/08/2015   PLT 208 06/08/2015      Assessment:   Healthy female exam.   Breakthrough bleeding on Nexplanon Tobacco abuse   Plan:    Blood tests: CBC with diff and Comprehensive metabolic panel. Breast self exam technique reviewed and patient encouraged to perform self-exam monthly. Contraception:  Nexplanon. Although with irregular cycles, not bothersome to patient.  Tobacco abuse - previously had cessation during recent pregnancy, however resumed approximately 1 month ago.  Notes life stressors as trigger for resumption.  Discussed smoking cessation techniques, stress management.  Patient notes that she will attempt cessation again, however not yet ready to set a quit date. Continue to encourage.  Follow up in 1 year for annual exam.     Hildred Laser, MD [facility]

## 2015-11-07 NOTE — Patient Instructions (Addendum)
RE: MyChart  Dear Ms. Savannah Richmond  We are excited to introduce MyChart, a new best-in-class service that provides you online access to important information in your electronic medical record. We want to make it easier for you to view your health information - all in one secure location - when and where you need it. We expect MyChart will enhance the quality of care and service we provide. Use the activation code below to enroll in MyChart online at [email or web address]  When you register for MyChart, you can:  Marland Kitchen View your test results. . Communicate securely with your physician's office.  . View your medical history, allergies, medications, and immunizations. . Conveniently print information such as your medication lists.  If you are age 27 or older and want a member of your family to have access to your record, you must provide written consent by completing a proxy form available at our facility. Please speak to our clinical staff about guidelines regarding accounts for patients younger than age 27.  As you activate your MyChart account and need any technical assistance, please call the MyChart technical support line at [phone]) or email your question to [email or web address]. If you email your question(s), please include your name, a return phone number and the best time to reach you.  Thank you for using MyChart as your new health and wellness resource!  MyChart Activation Code:  [email or web address] Expires: 11/10/2015  3:07 AM           [facility]  [place], [place] [address] Preventive Care for Adults, Female A healthy lifestyle and preventive care can promote health and wellness. Preventive health guidelines for women include the following key practices.  A routine yearly physical is a good way to check with your health care provider about your health and preventive screening. It is a chance to share any concerns and updates on your  health and to receive a thorough exam.  Visit your dentist for a routine exam and preventive care every 6 months. Brush your teeth twice a day and floss once a day. Good oral hygiene prevents tooth decay and gum disease.  The frequency of eye exams is based on your age, health, family medical history, use of contact lenses, and other factors. Follow your health care provider's recommendations for frequency of eye exams.  Eat a healthy diet. Foods like vegetables, fruits, whole grains, low-fat dairy products, and lean protein foods contain the nutrients you need without too many calories. Decrease your intake of foods high in solid fats, added sugars, and salt. Eat the right amount of calories for you.Get information about a proper diet from your health care provider, if necessary.  Regular physical exercise is one of the most important things you can do for your health. Most adults should get at least 150 minutes of moderate-intensity exercise (any activity that increases your heart rate and causes you to sweat) each week. In addition, most adults need muscle-strengthening exercises on 2 or more days a week.  Maintain a healthy weight. The body mass index (BMI) is a screening tool to identify possible weight problems. It provides an estimate of body fat based on height and weight. Your health care provider can find your BMI and can help you achieve or maintain a healthy weight.For adults 20 years and older:  A BMI below 18.5 is considered underweight.  A BMI of 18.5 to 24.9 is normal.  A BMI of 25 to 29.9 is considered overweight.  A BMI of 30 and above is considered obese.  Maintain normal blood lipids and cholesterol levels by exercising and minimizing your intake of saturated fat. Eat a balanced diet with plenty of fruit and vegetables. Blood tests for lipids and cholesterol should begin at age 27 and be repeated every 5 years. If your lipid or cholesterol levels are high, you are over 50,  or you are at high risk for heart disease, you may need your cholesterol levels checked more frequently.Ongoing high lipid and cholesterol levels should be treated with medicines if diet and exercise are not working.  If you smoke, find out from your health care provider how to quit. If you do not use tobacco, do not start.  Lung cancer screening is recommended for adults aged 27-80 years who are at high risk for developing lung cancer because of a history of smoking. A yearly low-dose CT scan of the lungs is recommended for people who have at least a 30-pack-year history of smoking and are a current smoker or have quit within the past 15 years. A pack year of smoking is smoking an average of 1 pack of cigarettes a day for 1 year (for example: 1 pack a day for 30 years or 2 packs a day for 15 years). Yearly screening should continue until the smoker has stopped smoking for at least 15 years. Yearly screening should be stopped for people who develop a health problem that would prevent them from having lung cancer treatment.  If you are pregnant, do not drink alcohol. If you are breastfeeding, be very cautious about drinking alcohol. If you are not pregnant and choose to drink alcohol, do not have more than 1 drink per day. One drink is considered to be 12 ounces (355 mL) of beer, 5 ounces (148 mL) of wine, or 1.5 ounces (44 mL) of liquor.  Avoid use of street drugs. Do not share needles with anyone. Ask for help if you need support or instructions about stopping the use of drugs.  High blood pressure causes heart disease and increases the risk of stroke. Your blood pressure should be checked at least every 1 to 2 years. Ongoing high blood pressure should be treated with medicines if weight loss and exercise do not work.  If you are 27-52 years old, ask your health care provider if you should take aspirin to prevent strokes.  Diabetes screening is done by taking a blood sample to check your blood  glucose level after you have not eaten for a certain period of time (fasting). If you are not overweight and you do not have risk factors for diabetes, you should be screened once every 3 years starting at age 72. If you are overweight or obese and you are 55-1 years of age, you should be screened for diabetes every year as part of your cardiovascular risk assessment.  Breast cancer screening is essential preventive care for women. You should practice "breast self-awareness." This means understanding the normal appearance and feel of your breasts and may include breast self-examination. Any changes detected, no matter how small, should be reported to a health care provider. Women in their 25s and 30s should have a clinical breast exam (CBE) by a health care provider as part of a regular health exam every 1 to 3 years. After age 72, women should have a CBE every year. Starting at age 49, women should consider having a mammogram (breast X-ray test) every year. Women who have a family history of  breast cancer should talk to their health care provider about genetic screening. Women at a high risk of breast cancer should talk to their health care providers about having an MRI and a mammogram every year.  Breast cancer gene (BRCA)-related cancer risk assessment is recommended for women who have family members with BRCA-related cancers. BRCA-related cancers include breast, ovarian, tubal, and peritoneal cancers. Having family members with these cancers may be associated with an increased risk for harmful changes (mutations) in the breast cancer genes BRCA1 and BRCA2. Results of the assessment will determine the need for genetic counseling and BRCA1 and BRCA2 testing.  Your health care provider may recommend that you be screened regularly for cancer of the pelvic organs (ovaries, uterus, and vagina). This screening involves a pelvic examination, including checking for microscopic changes to the surface of your cervix  (Pap test). You may be encouraged to have this screening done every 3 years, beginning at age 88.  For women ages 13-65, health care providers may recommend pelvic exams and Pap testing every 3 years, or they may recommend the Pap and pelvic exam, combined with testing for human papilloma virus (HPV), every 5 years. Some types of HPV increase your risk of cervical cancer. Testing for HPV may also be done on women of any age with unclear Pap test results.  Other health care providers may not recommend any screening for nonpregnant women who are considered low risk for pelvic cancer and who do not have symptoms. Ask your health care provider if a screening pelvic exam is right for you.  If you have had past treatment for cervical cancer or a condition that could lead to cancer, you need Pap tests and screening for cancer for at least 20 years after your treatment. If Pap tests have been discontinued, your risk factors (such as having a new sexual partner) need to be reassessed to determine if screening should resume. Some women have medical problems that increase the chance of getting cervical cancer. In these cases, your health care provider may recommend more frequent screening and Pap tests.  Colorectal cancer can be detected and often prevented. Most routine colorectal cancer screening begins at the age of 19 years and continues through age 43 years. However, your health care provider may recommend screening at an earlier age if you have risk factors for colon cancer. On a yearly basis, your health care provider may provide home test kits to check for hidden blood in the stool. Use of a small camera at the end of a tube, to directly examine the colon (sigmoidoscopy or colonoscopy), can detect the earliest forms of colorectal cancer. Talk to your health care provider about this at age 53, when routine screening begins. Direct exam of the colon should be repeated every 5-10 years through age 69 years,  unless early forms of precancerous polyps or small growths are found.  People who are at an increased risk for hepatitis B should be screened for this virus. You are considered at high risk for hepatitis B if:  You were born in a country where hepatitis B occurs often. Talk with your health care provider about which countries are considered high risk.  Your parents were born in a high-risk country and you have not received a shot to protect against hepatitis B (hepatitis B vaccine).  You have HIV or AIDS.  You use needles to inject street drugs.  You live with, or have sex with, someone who has hepatitis B.  You get  hemodialysis treatment.  You take certain medicines for conditions like cancer, organ transplantation, and autoimmune conditions.  Hepatitis C blood testing is recommended for all people born from [birth year] through 1965 and any individual with known risks for hepatitis C.  Practice safe sex. Use condoms and avoid high-risk sexual practices to reduce the spread of sexually transmitted infections (STIs). STIs include gonorrhea, chlamydia, syphilis, trichomonas, herpes, HPV, and human immunodeficiency virus (HIV). Herpes, HIV, and HPV are viral illnesses that have no cure. They can result in disability, cancer, and death.  You should be screened for sexually transmitted illnesses (STIs) including gonorrhea and chlamydia if:  You are sexually active and are younger than 24 years.  You are older than 24 years and your health care provider tells you that you are at risk for this type of infection.  Your sexual activity has changed since you were last screened and you are at an increased risk for chlamydia or gonorrhea. Ask your health care provider if you are at risk.  If you are at risk of being infected with HIV, it is recommended that you take a prescription medicine daily to prevent HIV infection. This is called preexposure prophylaxis (PrEP). You are considered at risk if:  You  are sexually active and do not regularly use condoms or know the HIV status of your partner(s).  You take drugs by injection.  You are sexually active with a partner who has HIV.  Talk with your health care provider about whether you are at high risk of being infected with HIV. If you choose to begin PrEP, you should first be tested for HIV. You should then be tested every 3 months for as long as you are taking PrEP.  Osteoporosis is a disease in which the bones lose minerals and strength with aging. This can result in serious bone fractures or breaks. The risk of osteoporosis can be identified using a bone density scan. Women ages 64 years and over and women at risk for fractures or osteoporosis should discuss screening with their health care providers. Ask your health care provider whether you should take a calcium supplement or vitamin D to reduce the rate of osteoporosis.  Menopause can be associated with physical symptoms and risks. Hormone replacement therapy is available to decrease symptoms and risks. You should talk to your health care provider about whether hormone replacement therapy is right for you.  Use sunscreen. Apply sunscreen liberally and repeatedly throughout the day. You should seek shade when your shadow is shorter than you. Protect yourself by wearing long sleeves, pants, a wide-brimmed hat, and sunglasses year round, whenever you are outdoors.  Once a month, do a whole body skin exam, using a mirror to look at the skin on your back. Tell your health care provider of new moles, moles that have irregular borders, moles that are larger than a pencil eraser, or moles that have changed in shape or color.  Stay current with required vaccines (immunizations).  Influenza vaccine. All adults should be immunized every year.  Tetanus, diphtheria, and acellular pertussis (Td, Tdap) vaccine. Pregnant women should receive 1 dose of Tdap vaccine during each pregnancy. The dose should be  obtained regardless of the length of time since the last dose. Immunization is preferred during the 27th-36th week of gestation. An adult who has not previously received Tdap or who does not know her vaccine status should receive 1 dose of Tdap. This initial dose should be followed by tetanus and diphtheria toxoids (Td)  booster doses every 10 years. Adults with an unknown or incomplete history of completing a 3-dose immunization series with Td-containing vaccines should begin or complete a primary immunization series including a Tdap dose. Adults should receive a Td booster every 10 years.  Varicella vaccine. An adult without evidence of immunity to varicella should receive 2 doses or a second dose if she has previously received 1 dose. Pregnant females who do not have evidence of immunity should receive the first dose after pregnancy. This first dose should be obtained before leaving the health care facility. The second dose should be obtained 4-8 weeks after the first dose.  Human papillomavirus (HPV) vaccine. Females aged 13-26 years who have not received the vaccine previously should obtain the 3-dose series. The vaccine is not recommended for use in pregnant females. However, pregnancy testing is not needed before receiving a dose. If a female is found to be pregnant after receiving a dose, no treatment is needed. In that case, the remaining doses should be delayed until after the pregnancy. Immunization is recommended for any person with an immunocompromised condition through the age of 49 years if she did not get any or all doses earlier. During the 3-dose series, the second dose should be obtained 4-8 weeks after the first dose. The third dose should be obtained 24 weeks after the first dose and 16 weeks after the second dose.  Zoster vaccine. One dose is recommended for adults aged 28 years or older unless certain conditions are present.  Measles, mumps, and rubella (MMR) vaccine. Adults born before  [birth year] generally are considered immune to measles and mumps. Adults born in [birth year] or later should have 1 or more doses of MMR vaccine unless there is a contraindication to the vaccine or there is laboratory evidence of immunity to each of the three diseases. A routine second dose of MMR vaccine should be obtained at least 28 days after the first dose for students attending postsecondary schools, health care workers, or international travelers. People who received inactivated measles vaccine or an unknown type of measles vaccine during 1963-1967 should receive 2 doses of MMR vaccine. People who received inactivated mumps vaccine or an unknown type of mumps vaccine before 1979 and are at high risk for mumps infection should consider immunization with 2 doses of MMR vaccine. For females of childbearing age, rubella immunity should be determined. If there is no evidence of immunity, females who are not pregnant should be vaccinated. If there is no evidence of immunity, females who are pregnant should delay immunization until after pregnancy. Unvaccinated health care workers born before [birth year] who lack laboratory evidence of measles, mumps, or rubella immunity or laboratory confirmation of disease should consider measles and mumps immunization with 2 doses of MMR vaccine or rubella immunization with 1 dose of MMR vaccine.  Pneumococcal 13-valent conjugate (PCV13) vaccine. When indicated, a person who is uncertain of his immunization history and has no record of immunization should receive the PCV13 vaccine. All adults 32 years of age and older should receive this vaccine. An adult aged 75 years or older who has certain medical conditions and has not been previously immunized should receive 1 dose of PCV13 vaccine. This PCV13 should be followed with a dose of pneumococcal polysaccharide (PPSV23) vaccine. Adults who are at high risk for pneumococcal disease should obtain the PPSV23 vaccine at least 8 weeks after the dose of  PCV13 vaccine. Adults older than 27 years of age who have normal immune system function should obtain  the PPSV23 vaccine dose at least 1 year after the dose of PCV13 vaccine.  Pneumococcal polysaccharide (PPSV23) vaccine. When PCV13 is also indicated, PCV13 should be obtained first. All adults aged 35 years and older should be immunized. An adult younger than age 73 years who has certain medical conditions should be immunized. Any person who resides in a nursing home or long-term care facility should be immunized. An adult smoker should be immunized. People with an immunocompromised condition and certain other conditions should receive both PCV13 and PPSV23 vaccines. People with human immunodeficiency virus (HIV) infection should be immunized as soon as possible after diagnosis. Immunization during chemotherapy or radiation therapy should be avoided. Routine use of PPSV23 vaccine is not recommended for American Indians, [place] Natives, or people younger than 65 years unless there are medical conditions that require PPSV23 vaccine. When indicated, people who have unknown immunization and have no record of immunization should receive PPSV23 vaccine. One-time revaccination 5 years after the first dose of PPSV23 is recommended for people aged 19-64 years who have chronic kidney failure, nephrotic syndrome, asplenia, or immunocompromised conditions. People who received 1-2 doses of PPSV23 before age 73 years should receive another dose of PPSV23 vaccine at age 39 years or later if at least 5 years have passed since the previous dose. Doses of PPSV23 are not needed for people immunized with PPSV23 at or after age 84 years.  Meningococcal vaccine. Adults with asplenia or persistent complement component deficiencies should receive 2 doses of quadrivalent meningococcal conjugate (MenACWY-D) vaccine. The doses should be obtained at least 2 months apart. Microbiologists working with certain meningococcal bacteria, [employer]  recruits, people at risk during an outbreak, and people who travel to or live in countries with a high rate of meningitis should be immunized. A first-year college student up through age 3 years who is living in a residence hall should receive a dose if she did not receive a dose on or after her 16th birthday. Adults who have certain high-risk conditions should receive one or more doses of vaccine.  Hepatitis A vaccine. Adults who wish to be protected from this disease, have certain high-risk conditions, work with hepatitis A-infected animals, work in hepatitis A research labs, or travel to or work in countries with a high rate of hepatitis A should be immunized. Adults who were previously unvaccinated and who anticipate close contact with an international adoptee during the first 60 days after arrival in the [place] from a country with a high rate of hepatitis A should be immunized.  Hepatitis B vaccine. Adults who wish to be protected from this disease, have certain high-risk conditions, may be exposed to blood or other infectious body fluids, are household contacts or sex partners of hepatitis B positive people, are clients or workers in certain care facilities, or travel to or work in countries with a high rate of hepatitis B should be immunized.  Haemophilus influenzae type b (Hib) vaccine. A previously unvaccinated person with asplenia or sickle cell disease or having a scheduled splenectomy should receive 1 dose of Hib vaccine. Regardless of previous immunization, a recipient of a hematopoietic stem cell transplant should receive a 3-dose series 6-12 months after her successful transplant. Hib vaccine is not recommended for adults with HIV infection. Preventive Services / Frequency Ages 64 to 12 years  Blood pressure check.** / Every 3-5 years.  Lipid and cholesterol check.** / Every 5 years beginning at age 20.  Clinical breast exam.** / Every 3 years for women  in their 1s and  30s.  BRCA-related cancer risk assessment.** / For women who have family members with a BRCA-related cancer (breast, ovarian, tubal, or peritoneal cancers).  Pap test.** / Every 2 years from ages 69 through 57. Every 3 years starting at age 78 through age 35 or 16 with a history of 3 consecutive normal Pap tests.  HPV screening.** / Every 3 years from ages 38 through ages 38 to 80 with a history of 3 consecutive normal Pap tests.  Hepatitis C blood test.** / For any individual with known risks for hepatitis C.  Skin self-exam. / Monthly.  Influenza vaccine. / Every year.  Tetanus, diphtheria, and acellular pertussis (Tdap, Td) vaccine.** / Consult your health care provider. Pregnant women should receive 1 dose of Tdap vaccine during each pregnancy. 1 dose of Td every 10 years.  Varicella vaccine.** / Consult your health care provider. Pregnant females who do not have evidence of immunity should receive the first dose after pregnancy.  HPV vaccine. / 3 doses over 6 months, if 54 and younger. The vaccine is not recommended for use in pregnant females. However, pregnancy testing is not needed before receiving a dose.  Measles, mumps, rubella (MMR) vaccine.** / You need at least 1 dose of MMR if you were born in 1957 or later. You may also need a 2nd dose. For females of childbearing age, rubella immunity should be determined. If there is no evidence of immunity, females who are not pregnant should be vaccinated. If there is no evidence of immunity, females who are pregnant should delay immunization until after pregnancy.  Pneumococcal 13-valent conjugate (PCV13) vaccine.** / Consult your health care provider.  Pneumococcal polysaccharide (PPSV23) vaccine.** / 1 to 2 doses if you smoke cigarettes or if you have certain conditions.  Meningococcal vaccine.** / 1 dose if you are age 89 to 38 years and a Market researcher living in a residence hall, or have one of several medical  conditions, you need to get vaccinated against meningococcal disease. You may also need additional booster doses.  Hepatitis A vaccine.** / Consult your health care provider.  Hepatitis B vaccine.** / Consult your health care provider.  Haemophilus influenzae type b (Hib) vaccine.** / Consult your health care provider. Ages 2 to 34 years  Blood pressure check.** / Every year.  Lipid and cholesterol check.** / Every 5 years beginning at age 42 years.  Lung cancer screening. / Every year if you are aged 30-80 years and have a 30-pack-year history of smoking and currently smoke or have quit within the past 15 years. Yearly screening is stopped once you have quit smoking for at least 15 years or develop a health problem that would prevent you from having lung cancer treatment.  Clinical breast exam.** / Every year after age 53 years.  BRCA-related cancer risk assessment.** / For women who have family members with a BRCA-related cancer (breast, ovarian, tubal, or peritoneal cancers).  Mammogram.** / Every year beginning at age 96 years and continuing for as long as you are in good health. Consult with your health care provider.  Pap test.** / Every 3 years starting at age 3 years through age 53 or 12 years with a history of 3 consecutive normal Pap tests.  HPV screening.** / Every 3 years from ages 87 years through ages 28 to 44 years with a history of 3 consecutive normal Pap tests.  Fecal occult blood test (FOBT) of stool. / Every year beginning at age 58  years and continuing until age 62 years. You may not need to do this test if you get a colonoscopy every 10 years.  Flexible sigmoidoscopy or colonoscopy.** / Every 5 years for a flexible sigmoidoscopy or every 10 years for a colonoscopy beginning at age 24 years and continuing until age 42 years.  Hepatitis C blood test.** / For all people born from [birth year] through 1967 and any individual with known risks for hepatitis C.  Skin  self-exam. / Monthly.  Influenza vaccine. / Every year.  Tetanus, diphtheria, and acellular pertussis (Tdap/Td) vaccine.** / Consult your health care provider. Pregnant women should receive 1 dose of Tdap vaccine during each pregnancy. 1 dose of Td every 10 years.  Varicella vaccine.** / Consult your health care provider. Pregnant females who do not have evidence of immunity should receive the first dose after pregnancy.  Zoster vaccine.** / 1 dose for adults aged 38 years or older.  Measles, mumps, rubella (MMR) vaccine.** / You need at least 1 dose of MMR if you were born in 1957 or later. You may also need a second dose. For females of childbearing age, rubella immunity should be determined. If there is no evidence of immunity, females who are not pregnant should be vaccinated. If there is no evidence of immunity, females who are pregnant should delay immunization until after pregnancy.  Pneumococcal 13-valent conjugate (PCV13) vaccine.** / Consult your health care provider.  Pneumococcal polysaccharide (PPSV23) vaccine.** / 1 to 2 doses if you smoke cigarettes or if you have certain conditions.  Meningococcal vaccine.** / Consult your health care provider.  Hepatitis A vaccine.** / Consult your health care provider.  Hepatitis B vaccine.** / Consult your health care provider.  Haemophilus influenzae type b (Hib) vaccine.** / Consult your health care provider. Ages 46 years and over  Blood pressure check.** / Every year.  Lipid and cholesterol check.** / Every 5 years beginning at age 20 years.  Lung cancer screening. / Every year if you are aged 77-80 years and have a 30-pack-year history of smoking and currently smoke or have quit within the past 15 years. Yearly screening is stopped once you have quit smoking for at least 15 years or develop a health problem that would prevent you from having lung cancer treatment.  Clinical breast exam.** / Every year after age 82  years.  BRCA-related cancer risk assessment.** / For women who have family members with a BRCA-related cancer (breast, ovarian, tubal, or peritoneal cancers).  Mammogram.** / Every year beginning at age 72 years and continuing for as long as you are in good health. Consult with your health care provider.  Pap test.** / Every 3 years starting at age 73 years through age 33 or 35 years with 3 consecutive normal Pap tests. Testing can be stopped between 65 and 70 years with 3 consecutive normal Pap tests and no abnormal Pap or HPV tests in the past 10 years.  HPV screening.** / Every 3 years from ages 29 years through ages 66 or 48 years with a history of 3 consecutive normal Pap tests. Testing can be stopped between 65 and 70 years with 3 consecutive normal Pap tests and no abnormal Pap or HPV tests in the past 10 years.  Fecal occult blood test (FOBT) of stool. / Every year beginning at age 51 years and continuing until age 49 years. You may not need to do this test if you get a colonoscopy every 10 years.  Flexible sigmoidoscopy or colonoscopy.** /  Every 5 years for a flexible sigmoidoscopy or every 10 years for a colonoscopy beginning at age 59 years and continuing until age 44 years.  Hepatitis C blood test.** / For all people born from [birth year] through 1965 and any individual with known risks for hepatitis C.  Osteoporosis screening.** / A one-time screening for women ages 21 years and over and women at risk for fractures or osteoporosis.  Skin self-exam. / Monthly.  Influenza vaccine. / Every year.  Tetanus, diphtheria, and acellular pertussis (Tdap/Td) vaccine.** / 1 dose of Td every 10 years.  Varicella vaccine.** / Consult your health care provider.  Zoster vaccine.** / 1 dose for adults aged 66 years or older.  Pneumococcal 13-valent conjugate (PCV13) vaccine.** / Consult your health care provider.  Pneumococcal polysaccharide (PPSV23) vaccine.** / 1 dose for all adults aged 63  years and older.  Meningococcal vaccine.** / Consult your health care provider.  Hepatitis A vaccine.** / Consult your health care provider.  Hepatitis B vaccine.** / Consult your health care provider.  Haemophilus influenzae type b (Hib) vaccine.** / Consult your health care provider. ** Family history and personal history of risk and conditions may change your health care provider's recommendations.   This information is not intended to replace advice given to you by your health care provider. Make sure you discuss any questions you have with your health care provider.   Document Released: 07/23/2001 Document Revised: 06/17/2014 Document Reviewed: 10/22/2010 Elsevier Interactive Patient Education [employer].

## 2015-11-08 LAB — COMPREHENSIVE METABOLIC PANEL
ALT: 24 IU/L (ref 0–32)
AST: 27 IU/L (ref 0–40)
Albumin/Globulin Ratio: 1.8 (ref 1.2–2.2)
Albumin: 4.7 g/dL (ref 3.5–5.5)
Alkaline Phosphatase: 66 IU/L (ref 39–117)
BUN/Creatinine Ratio: 10 (ref 9–23)
BUN: 8 mg/dL (ref 6–20)
Bilirubin Total: 0.9 mg/dL (ref 0.0–1.2)
CALCIUM: 9.3 mg/dL (ref 8.7–10.2)
CO2: 20 mmol/L (ref 18–29)
CREATININE: 0.8 mg/dL (ref 0.57–1.00)
Chloride: 101 mmol/L (ref 96–106)
GFR, EST AFRICAN AMERICAN: 118 mL/min/{1.73_m2} (ref >59)
GFR, EST NON AFRICAN AMERICAN: 102 mL/min/{1.73_m2} (ref >59)
GLUCOSE: 85 mg/dL (ref 65–99)
Globulin, Total: 2.6 g/dL (ref 1.5–4.5)
Potassium: 3.9 mmol/L (ref 3.5–5.2)
Sodium: 139 mmol/L (ref 134–144)
TOTAL PROTEIN: 7.3 g/dL (ref 6.0–8.5)

## 2015-11-08 LAB — CBC
Hematocrit: 35.9 % (ref 34.0–46.6)
Hemoglobin: 12 g/dL (ref 11.1–15.9)
MCH: 30 pg (ref 26.6–33.0)
MCHC: 33.4 g/dL (ref 31.5–35.7)
MCV: 90 fL (ref 79–97)
PLATELETS: 242 10*3/uL (ref 150–379)
RBC: 4 x10E6/uL (ref 3.77–5.28)
RDW: 15.2 % (ref 12.3–15.4)
WBC: 7.6 10*3/uL (ref 3.4–10.8)

## 2016-09-30 DIAGNOSIS — R5381 Other malaise: Secondary | ICD-10-CM | POA: Diagnosis not present

## 2016-09-30 DIAGNOSIS — Z79899 Other long term (current) drug therapy: Secondary | ICD-10-CM | POA: Diagnosis not present

## 2016-09-30 DIAGNOSIS — Z1329 Encounter for screening for other suspected endocrine disorder: Secondary | ICD-10-CM | POA: Diagnosis not present

## 2016-09-30 DIAGNOSIS — R5382 Chronic fatigue, unspecified: Secondary | ICD-10-CM | POA: Diagnosis not present

## 2016-10-07 DIAGNOSIS — F329 Major depressive disorder, single episode, unspecified: Secondary | ICD-10-CM | POA: Diagnosis not present

## 2016-10-07 DIAGNOSIS — R5382 Chronic fatigue, unspecified: Secondary | ICD-10-CM | POA: Diagnosis not present

## 2016-10-07 DIAGNOSIS — F419 Anxiety disorder, unspecified: Secondary | ICD-10-CM | POA: Diagnosis not present

## 2016-11-07 ENCOUNTER — Encounter: Payer: BLUE CROSS/BLUE SHIELD | Admitting: Obstetrics and Gynecology

## 2016-11-21 ENCOUNTER — Encounter: Payer: BLUE CROSS/BLUE SHIELD | Admitting: Obstetrics and Gynecology

## 2016-11-21 DIAGNOSIS — R5382 Chronic fatigue, unspecified: Secondary | ICD-10-CM | POA: Diagnosis not present

## 2016-11-21 DIAGNOSIS — F419 Anxiety disorder, unspecified: Secondary | ICD-10-CM | POA: Diagnosis not present

## 2016-11-21 DIAGNOSIS — F329 Major depressive disorder, single episode, unspecified: Secondary | ICD-10-CM | POA: Diagnosis not present

## 2016-11-22 ENCOUNTER — Encounter: Payer: BLUE CROSS/BLUE SHIELD | Admitting: Obstetrics and Gynecology

## 2017-01-22 ENCOUNTER — Encounter: Payer: Self-pay | Admitting: Obstetrics and Gynecology

## 2017-01-22 ENCOUNTER — Ambulatory Visit (INDEPENDENT_AMBULATORY_CARE_PROVIDER_SITE_OTHER): Payer: BLUE CROSS/BLUE SHIELD | Admitting: Obstetrics and Gynecology

## 2017-01-22 VITALS — BP 91/61 | HR 83 | Ht 63.0 in | Wt 127.3 lb

## 2017-01-22 DIAGNOSIS — L659 Nonscarring hair loss, unspecified: Secondary | ICD-10-CM | POA: Diagnosis not present

## 2017-01-22 DIAGNOSIS — R5383 Other fatigue: Secondary | ICD-10-CM

## 2017-01-22 DIAGNOSIS — Z8349 Family history of other endocrine, nutritional and metabolic diseases: Secondary | ICD-10-CM | POA: Diagnosis not present

## 2017-01-22 DIAGNOSIS — L679 Hair color and hair shaft abnormality, unspecified: Secondary | ICD-10-CM | POA: Diagnosis not present

## 2017-01-22 NOTE — Patient Instructions (Addendum)

## 2017-01-22 NOTE — Progress Notes (Signed)
    GYNECOLOGY PROGRESS NOTE  Subjective:    Patient ID: Savannah Richmond, female    DOB: 01/16/1991, 26 y.o.   MRN: [id number]  HPI  Patient is a 26 y.o. 852P1102 female who presents for complaints of increasing fatigue x 2 months, and no hair growth - length, with hair thinning (no significant hair loss since after her postpartum state).  Takes bitoin and Vitamin C and prenatal vitamins. Notes she gets fairly adequate rest, has a "mostly healthy" diet, and consumes plenty of water.  Denies any recent stressors. Patient notes that she would like her hormone and vitamin levels checked. Is also concerned it may be related to her thyroid as she has a mother with thyroid disease. Patient does have a history of depression but notes this is currently well controlled with meds, denies depressive symptoms.   The following portions of the patient's history were reviewed and updated as appropriate: allergies, current medications, past family history, past medical history, past social history, past surgical history and problem list.  Review of Systems Pertinent items noted in HPI and remainder of comprehensive ROS otherwise negative.    Objective:   Blood pressure 91/61, pulse 83, height 5\' 3"  (1.6 m), weight 127 lb 5 oz (57.7 kg), last menstrual period 01/09/2017, not currently breastfeeding. General appearance: alert and no distress  Head: Normocephalic, without obvious abnormality, atraumatic, no scalp lesions. Thinning of hair appreciated.  Eyes: negative findings: lids and lashes normal, conjunctivae and sclerae normal and pupils equal, round, reactive to light and accomodation Throat: lips, mucosa, and tongue normal; teeth and gums normal Neck: no adenopathy, no carotid bruit, no JVD, supple, symmetrical, trachea midline and thyroid not enlarged, symmetric, no tenderness/mass/nodules Skin: no sores or suspicious lesions or rashes or color changes Extremities: extremities normal, atraumatic, no cyanosis  or edema Neurologic: Grossly normal Psychological: negative for - anxiety, concentration difficulties, depression or mood swings   Assessment/Plan:   Fatigue, unspecified type - Plan: Vitamin D (25 hydroxy), TSH, CBC, IBC panel  Hair thinning - Plan: Vitamin B12, Vitamin B1, Vitamin B6, Vitamin A, Vitamin E  Family history of thyroid disease in mother - Plan: TSH  Will notify patient of results by phone and follow up accordingly.     Hildred Laserherry, Milus Fritze, MD [facility]

## 2017-01-22 NOTE — Progress Notes (Signed)
Pt is here requesting hormone level and thyroid level be drawn. States her hair isn't growing

## 2017-01-28 ENCOUNTER — Telehealth: Payer: Self-pay | Admitting: Obstetrics and Gynecology

## 2017-01-28 NOTE — Telephone Encounter (Signed)
Please inform patient that all of her labs were normal, except her Vitamin A and Vitamin E levels which have not returned yet. Her Vitamin B6 was actually elevated (but this is likely due to her taking hair growth pills which often contain higher levels of B6).

## 2017-01-28 NOTE — Telephone Encounter (Signed)
Left results on your desk, please advise

## 2017-01-28 NOTE — Telephone Encounter (Signed)
Called pt informed her that vitamin A & E were not back yet, however everything else was normal. Pt gave verbal understanding.

## 2017-01-28 NOTE — Telephone Encounter (Signed)
Pt is requesting her lab results from 01/22/17. Please advise. Thanks TNP

## 2017-01-31 LAB — VITAMIN D 25 HYDROXY (VIT D DEFICIENCY, FRACTURES): Vit D, 25-Hydroxy: 62.8 ng/mL (ref 30.0–100.0)

## 2017-01-31 LAB — VITAMIN B6: Vitamin B6: 78.5 ug/L — ABNORMAL HIGH (ref 2.0–32.8)

## 2017-01-31 LAB — VITAMIN E
VITAMIN E (ALPHA TOCOPHEROL): 7.9 mg/L (ref 5.9–19.4)
VITAMIN E(GAMMA TOCOPHEROL): 1.3 mg/L (ref 0.7–4.9)

## 2017-01-31 LAB — VITAMIN B12: VITAMIN B 12: 921 pg/mL (ref 232–1245)

## 2017-01-31 LAB — CBC
HEMOGLOBIN: 13.4 g/dL (ref 11.1–15.9)
Hematocrit: 40.5 % (ref 34.0–46.6)
MCH: 31.6 pg (ref 26.6–33.0)
MCHC: 33.1 g/dL (ref 31.5–35.7)
MCV: 96 fL (ref 79–97)
PLATELETS: 216 10*3/uL (ref 150–379)
RBC: 4.24 x10E6/uL (ref 3.77–5.28)
RDW: 14.5 % (ref 12.3–15.4)
WBC: 6 10*3/uL (ref 3.4–10.8)

## 2017-01-31 LAB — TSH: TSH: 0.688 u[IU]/mL (ref 0.450–4.500)

## 2017-01-31 LAB — VITAMIN B1: Thiamine: 164.4 nmol/L (ref 66.5–200.0)

## 2017-01-31 LAB — VITAMIN A: Vitamin A: 53.6 ug/dL (ref 31.2–89.1)

## 2017-02-03 ENCOUNTER — Other Ambulatory Visit: Payer: Self-pay | Admitting: Family Medicine

## 2017-02-03 ENCOUNTER — Ambulatory Visit
Admission: RE | Admit: 2017-02-03 | Discharge: 2017-02-03 | Disposition: A | Discharge status: Home / Self Care | Payer: BLUE CROSS/BLUE SHIELD | Source: Ambulatory Visit | Attending: Family Medicine | Admitting: Family Medicine

## 2017-02-03 ENCOUNTER — Ambulatory Visit: Payer: BLUE CROSS/BLUE SHIELD

## 2017-02-03 DIAGNOSIS — K429 Umbilical hernia without obstruction or gangrene: Secondary | ICD-10-CM | POA: Diagnosis not present

## 2017-02-03 DIAGNOSIS — R1031 Right lower quadrant pain: Secondary | ICD-10-CM

## 2017-02-03 DIAGNOSIS — R300 Dysuria: Secondary | ICD-10-CM | POA: Diagnosis not present

## 2017-02-03 DIAGNOSIS — N83202 Unspecified ovarian cyst, left side: Secondary | ICD-10-CM | POA: Diagnosis not present

## 2017-02-03 DIAGNOSIS — R109 Unspecified abdominal pain: Secondary | ICD-10-CM | POA: Diagnosis not present

## 2017-02-03 MED ORDER — IOPAMIDOL (ISOVUE-300) INJECTION 61%
100.0000 mL | Freq: Once | INTRAVENOUS | Status: AC | PRN
  Administered 2017-02-03: 100 mL via INTRAVENOUS

## 2017-02-19 DIAGNOSIS — R5382 Chronic fatigue, unspecified: Secondary | ICD-10-CM | POA: Diagnosis not present

## 2017-02-19 DIAGNOSIS — F329 Major depressive disorder, single episode, unspecified: Secondary | ICD-10-CM | POA: Diagnosis not present

## 2017-02-19 DIAGNOSIS — F419 Anxiety disorder, unspecified: Secondary | ICD-10-CM | POA: Diagnosis not present

## 2017-03-13 DIAGNOSIS — Z23 Encounter for immunization: Secondary | ICD-10-CM | POA: Diagnosis not present

## 2017-10-14 DIAGNOSIS — F329 Major depressive disorder, single episode, unspecified: Secondary | ICD-10-CM | POA: Diagnosis not present

## 2017-10-14 DIAGNOSIS — F419 Anxiety disorder, unspecified: Secondary | ICD-10-CM | POA: Diagnosis not present

## 2017-10-14 DIAGNOSIS — F3341 Major depressive disorder, recurrent, in partial remission: Secondary | ICD-10-CM | POA: Diagnosis not present

## 2017-11-20 DIAGNOSIS — F329 Major depressive disorder, single episode, unspecified: Secondary | ICD-10-CM | POA: Diagnosis not present

## 2017-11-20 DIAGNOSIS — F419 Anxiety disorder, unspecified: Secondary | ICD-10-CM | POA: Diagnosis not present

## 2018-03-24 IMAGING — CT CT ABD-PELV W/ CM
2 of 4 series · 16 of 46 positions shown, 18 images · IV contrast (APPLIED)
Comparison: None.

CLINICAL DATA: Right lower quadrant abdominal pain for the past
week, worse today. Nausea. Clinical concern for appendicitis.

EXAM:
CT ABDOMEN AND PELVIS WITH CONTRAST
TECHNIQUE: Multidetector CT imaging of the abdomen and pelvis was performed
using the standard protocol following bolus administration of
intravenous contrast.
CONTRAST:  100mL L65ANY-VYY IOPAMIDOL (L65ANY-VYY) INJECTION 61%

[Series 2: axial st · axial · 0.73mm/px · z∈[-871,-476]mm · 13 of 87 slices shown, 15 images]
[im 4/87  soft-tissue]
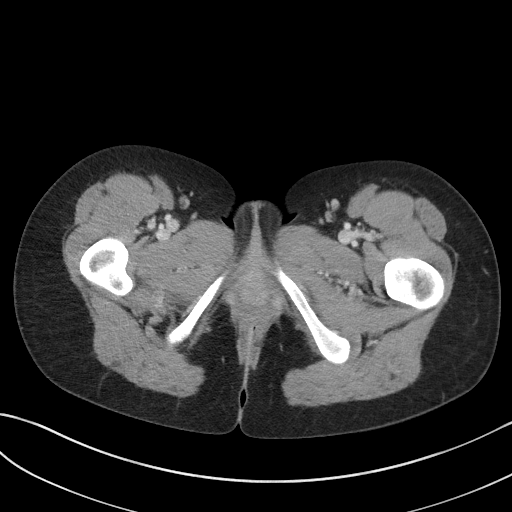
[im 4/87  bone]
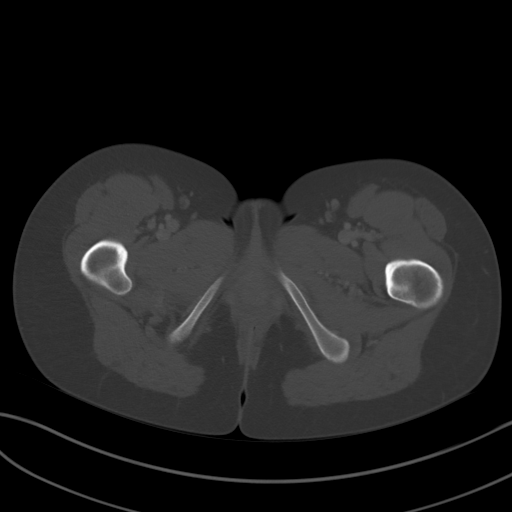
[im 11/87  soft-tissue]
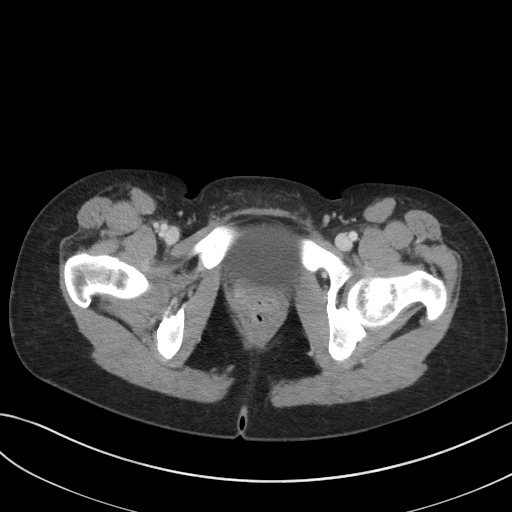
[im 18/87  soft-tissue]
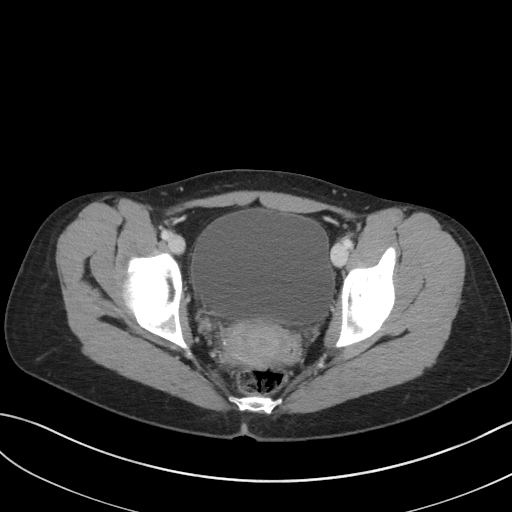
[im 26/87  soft-tissue]
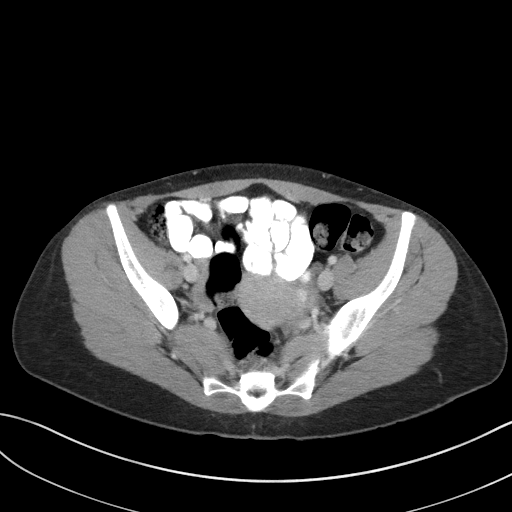
[im 29/87  soft-tissue]
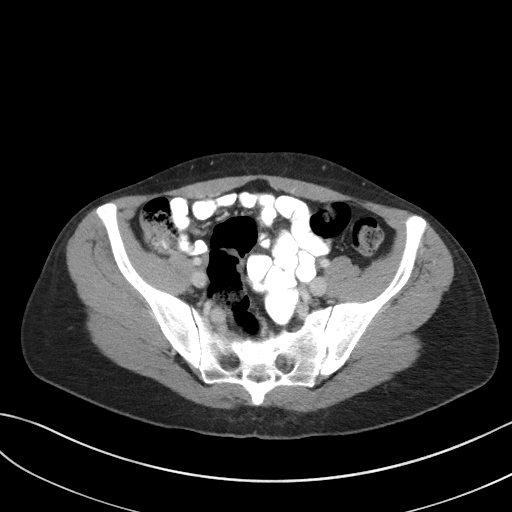
[im 36/87  soft-tissue]
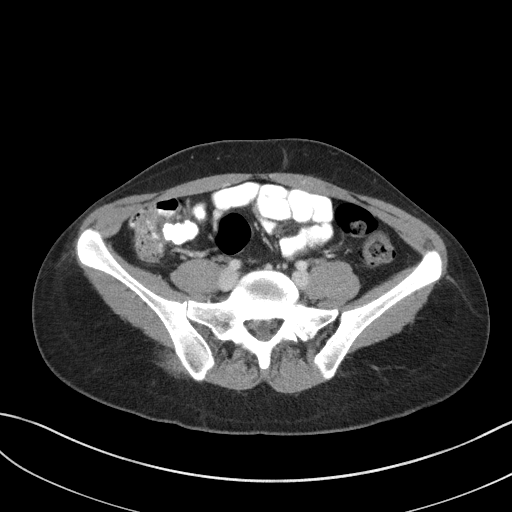
[im 44/87  soft-tissue]
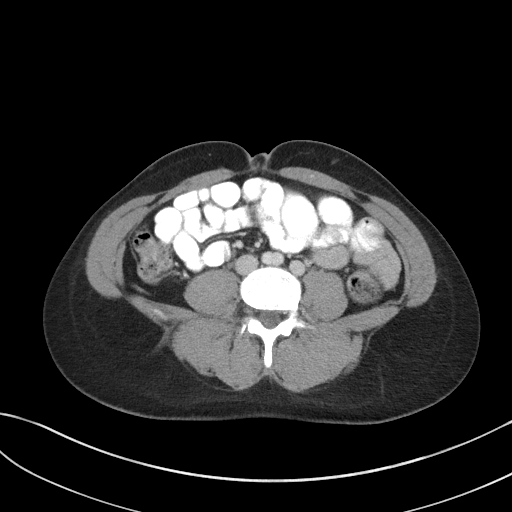
[im 51/87  soft-tissue]
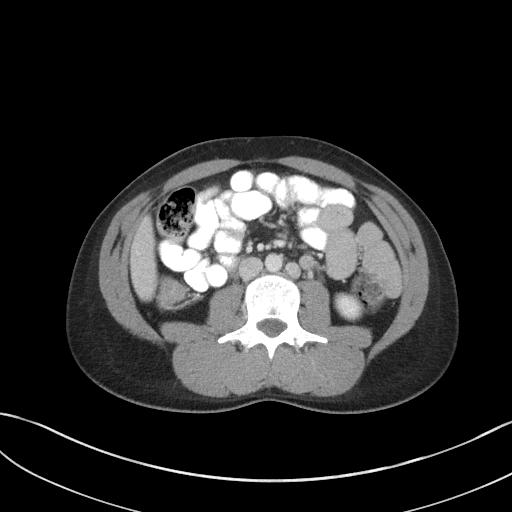
[im 58/87  soft-tissue]
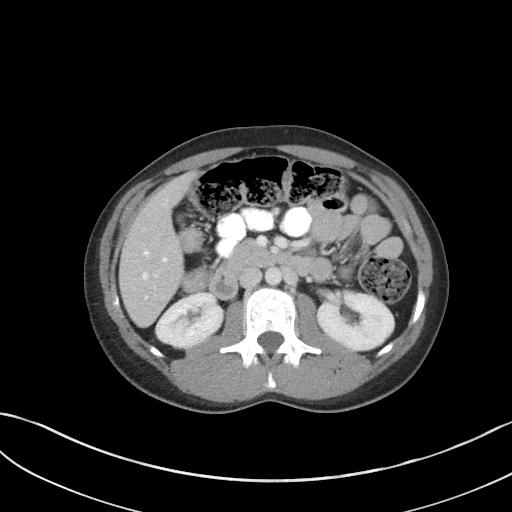
[im 58/87  bone]
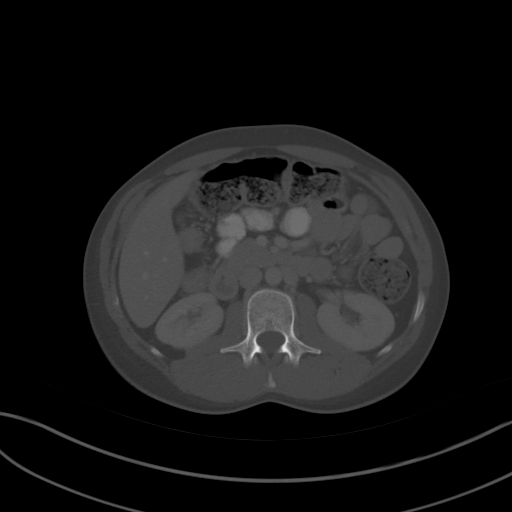
[im 61/87  soft-tissue]
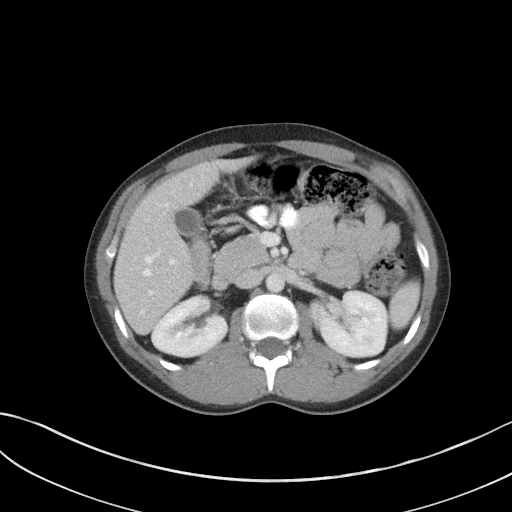
[im 69/87  soft-tissue]
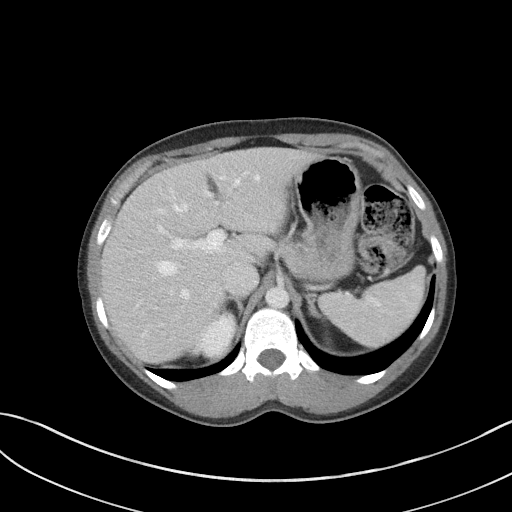
[im 76/87  soft-tissue]
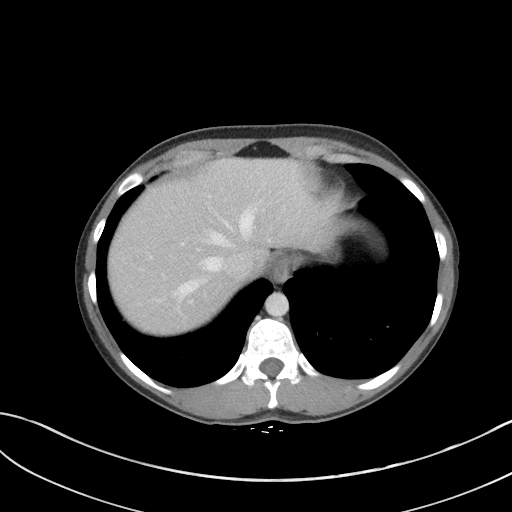
[im 83/87  soft-tissue]
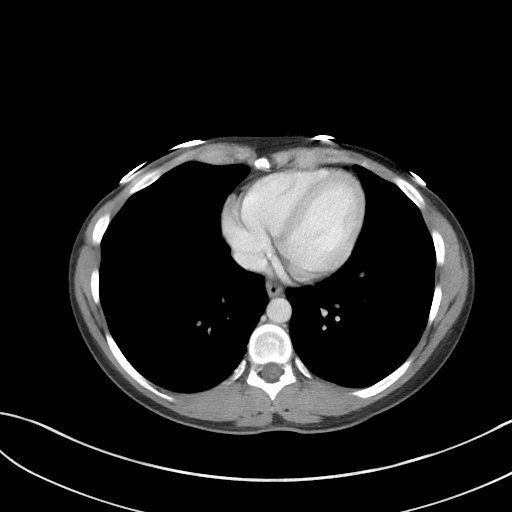

[Series 5: coronal st · coronal · 0.65mm/px · 3 of 83 slices shown]
[im 28/83  soft-tissue]
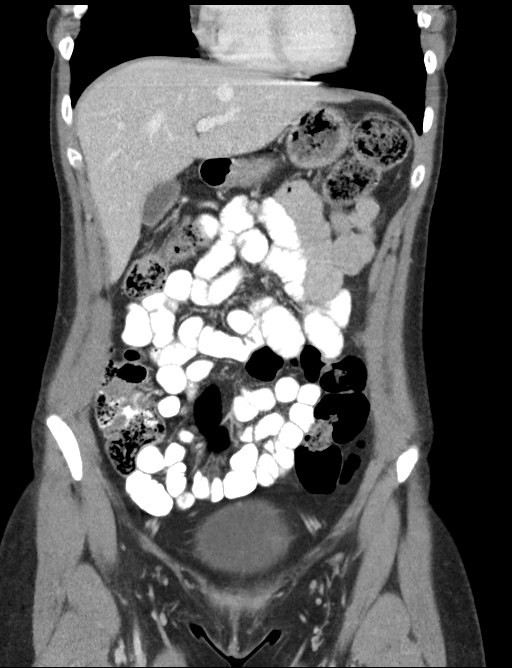
[im 37/83  soft-tissue]
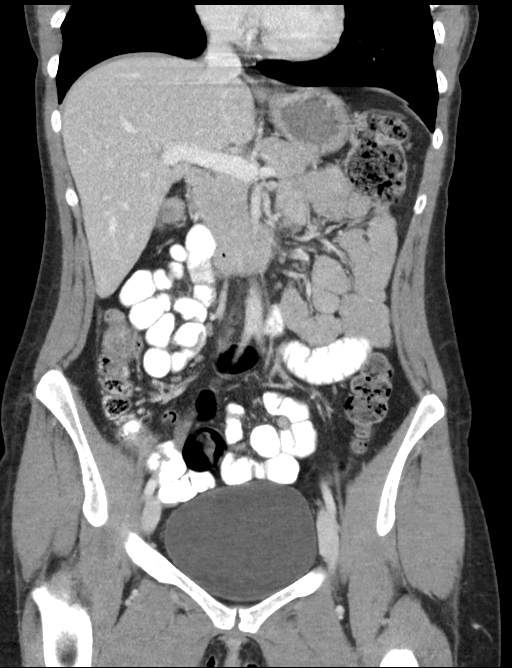
[im 46/83  soft-tissue]
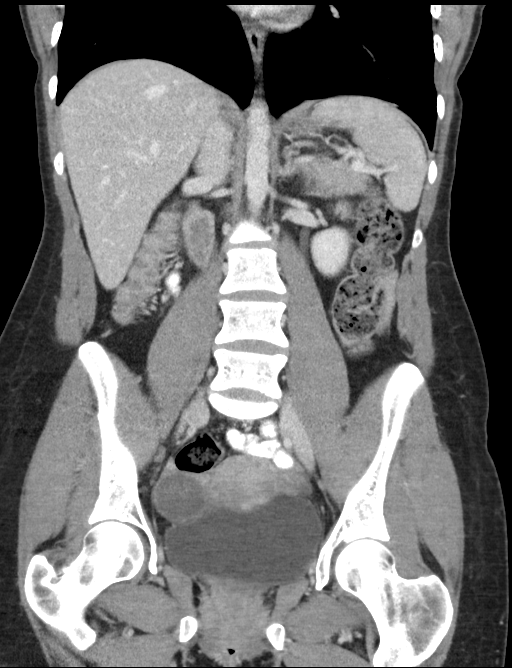

[16 of 46 positions shown; findings below may reference images not displayed]

FINDINGS: Lower chest: Unremarkable.

Hepatobiliary: No focal liver abnormality is seen. No gallstones,
gallbladder wall thickening, or biliary dilatation.

Pancreas: Unremarkable. No pancreatic ductal dilatation or
surrounding inflammatory changes.

Spleen: Normal in size without focal abnormality.

Adrenals/Urinary Tract: Adrenal glands are unremarkable. Kidneys are
normal, without renal calculi, focal lesion, or hydronephrosis.
Bladder is unremarkable.

Stomach/Bowel: Stomach is within normal limits. Appendix appears
normal. No evidence of bowel wall thickening, distention, or
inflammatory changes.

Vascular/Lymphatic: No significant vascular findings are present. No
enlarged abdominal or pelvic lymph nodes.

Reproductive: 4.3 x 3.5 cm right ovarian cyst on image number 66 of
series 2 and 3.3 x 0.8 cm left ovarian cyst on image number 65 of
series 2. These have CT features of simple cysts. Normal appearing
uterus.

Other: Small umbilical hernia containing fat. Minimal free
peritoneal fluid in the pelvic cul-de-sac, within normal limits of
physiological fluid.

Musculoskeletal: Left iliac bone island. Normal appearing lumbar and
lower thoracic spine.
IMPRESSION: 1. No acute abnormality.  Specifically, no evidence of appendicitis.
2. 4.3 cm and 3.3 cm simple appearing right and left ovarian cyst
respectively. These are almost certainly benign, and no specific
imaging follow up is recommended according to the Society of
Radiologists in Ultrasound 0191 Consensus Conference Statement (BELLIO
Kramat et al. Management of Asymptomatic Ovarian and Other Adnexal
Cysts Imaged at US: Society of Radiologists in Ultrasound Consensus
3. Small umbilical hernia containing fat.

## 2018-07-15 DIAGNOSIS — J069 Acute upper respiratory infection, unspecified: Secondary | ICD-10-CM | POA: Diagnosis not present

## 2018-07-15 DIAGNOSIS — K120 Recurrent oral aphthae: Secondary | ICD-10-CM | POA: Diagnosis not present

## 2018-09-07 DIAGNOSIS — M5489 Other dorsalgia: Secondary | ICD-10-CM | POA: Diagnosis not present

## 2018-09-07 DIAGNOSIS — M545 Low back pain: Secondary | ICD-10-CM | POA: Diagnosis not present

## 2018-10-16 DIAGNOSIS — Z13220 Encounter for screening for lipoid disorders: Secondary | ICD-10-CM | POA: Diagnosis not present

## 2018-10-16 DIAGNOSIS — Z79899 Other long term (current) drug therapy: Secondary | ICD-10-CM | POA: Diagnosis not present

## 2018-10-16 DIAGNOSIS — Z1389 Encounter for screening for other disorder: Secondary | ICD-10-CM | POA: Diagnosis not present

## 2018-10-16 DIAGNOSIS — Z0000 Encounter for general adult medical examination without abnormal findings: Secondary | ICD-10-CM | POA: Diagnosis not present

## 2018-10-16 DIAGNOSIS — Z131 Encounter for screening for diabetes mellitus: Secondary | ICD-10-CM | POA: Diagnosis not present

## 2018-10-16 DIAGNOSIS — F419 Anxiety disorder, unspecified: Secondary | ICD-10-CM | POA: Diagnosis not present

## 2018-11-13 DIAGNOSIS — J069 Acute upper respiratory infection, unspecified: Secondary | ICD-10-CM | POA: Diagnosis not present

## 2019-01-27 ENCOUNTER — Encounter: Payer: Self-pay | Admitting: Obstetrics and Gynecology

## 2019-01-27 ENCOUNTER — Ambulatory Visit (INDEPENDENT_AMBULATORY_CARE_PROVIDER_SITE_OTHER): Payer: BC Managed Care – PPO | Admitting: Obstetrics and Gynecology

## 2019-01-27 ENCOUNTER — Other Ambulatory Visit: Payer: Self-pay

## 2019-01-27 VITALS — BP 115/80 | HR 71 | Ht 63.0 in | Wt 127.1 lb

## 2019-01-27 DIAGNOSIS — Z3049 Encounter for surveillance of other contraceptives: Secondary | ICD-10-CM | POA: Diagnosis not present

## 2019-01-27 DIAGNOSIS — Z3046 Encounter for surveillance of implantable subdermal contraceptive: Secondary | ICD-10-CM

## 2019-01-27 DIAGNOSIS — Z30017 Encounter for initial prescription of implantable subdermal contraceptive: Secondary | ICD-10-CM

## 2019-01-27 NOTE — Progress Notes (Signed)
Pt is present today for nexplanon removal/insertion. Pt stated that she was doing well no problems.

## 2019-01-27 NOTE — Patient Instructions (Addendum)
NEXPLANON PLACEMENT POST-PROCEDURE INSTRUCTIONS  1. You may take Ibuprofen, Aleve or Tylenol for pain if needed.  Pain should resolve within in 24 hours.  You may have intercourse after 24 hours.  If you using this for birth control, use a back-up method for 1 week.  2. You need to call if you have any fever, heavy bleeding, or redness at insertion site. Irregular bleeding is common the first several months after having a Nexplanonplaced. You do not need to call for this reason unless you are concerned.  3. Shower or bathe as normal.  You can remove the bandage after 24 hours.

## 2019-01-27 NOTE — Progress Notes (Signed)
   [facility] [address] [place], [place]  [address] Phone:  [phone]   Fax:  [phone]   GYNECOLOGY OFFICE PROCEDURE NOTE  Savannah Richmond is a 28 y.o. [id number] here for Nexplanon removal with new Nexplanon insertion.  Last pap smear was on 06/10/2012 and was normal.  No other gynecologic concerns.   Nexplanon Removal and Insertion  Patient identified, informed consent performed, consent signed.   Patient does understand that irregular bleeding is a very common side effect of this medication. She was advised to have backup contraception for one week after replacement of the implant. Pregnancy test in clinic today was negative.  Appropriate time out taken. Implanon site identified. Area prepped in usual sterile fashon. One ml of 1% lidocaine was used to anesthetize the area at the distal end of the implant. A small stab incision was made right beside the implant on the distal portion. The Nexplanon rod was grasped using hemostats and removed without difficulty. There was minimal blood loss. There were no complications. Area was then injected with 3 ml of 1 % lidocaine. She was re-prepped with betadine, Nexplanon removed from packaging, Device confirmed in needle, then inserted full length of needle and withdrawn per handbook instructions. Nexplanon was able to palpated in the patient's arm; patient palpated the insert herself.  There was minimal blood loss. Patient insertion site covered with guaze and a pressure bandage to reduce any bruising. The patient tolerated the procedure well and was given post procedure instructions.  She was advised to have backup contraception for one week.  Patient to return in 3 months for annual exam.   Lot: [id number] Exp: 04/06/2021  Rubie Maid, MD [facility]

## 2019-04-23 DIAGNOSIS — R5382 Chronic fatigue, unspecified: Secondary | ICD-10-CM | POA: Diagnosis not present

## 2019-04-23 DIAGNOSIS — F419 Anxiety disorder, unspecified: Secondary | ICD-10-CM | POA: Diagnosis not present

## 2019-04-23 DIAGNOSIS — F329 Major depressive disorder, single episode, unspecified: Secondary | ICD-10-CM | POA: Diagnosis not present

## 2019-04-30 ENCOUNTER — Encounter: Payer: Self-pay | Admitting: Obstetrics and Gynecology

## 2019-04-30 ENCOUNTER — Other Ambulatory Visit: Payer: Self-pay

## 2019-04-30 ENCOUNTER — Ambulatory Visit (INDEPENDENT_AMBULATORY_CARE_PROVIDER_SITE_OTHER): Payer: BC Managed Care – PPO | Admitting: Obstetrics and Gynecology

## 2019-04-30 ENCOUNTER — Other Ambulatory Visit (HOSPITAL_COMMUNITY)
Admission: RE | Admit: 2019-04-30 | Discharge: 2019-04-30 | Disposition: A | Discharge status: Home / Self Care | Payer: BC Managed Care – PPO | Source: Ambulatory Visit | Attending: Obstetrics and Gynecology | Admitting: Obstetrics and Gynecology

## 2019-04-30 VITALS — BP 122/79 | HR 74 | Ht 63.0 in | Wt 129.1 lb

## 2019-04-30 DIAGNOSIS — Z124 Encounter for screening for malignant neoplasm of cervix: Secondary | ICD-10-CM | POA: Insufficient documentation

## 2019-04-30 DIAGNOSIS — Z975 Presence of (intrauterine) contraceptive device: Secondary | ICD-10-CM

## 2019-04-30 DIAGNOSIS — Z01419 Encounter for gynecological examination (general) (routine) without abnormal findings: Secondary | ICD-10-CM

## 2019-04-30 NOTE — Progress Notes (Signed)
GYNECOLOGY ANNUAL PHYSICAL EXAM PROGRESS NOTE  Subjective:    Savannah Richmond is a 28 y.o. 572P1102 female who presents for an annual exam. The patient has no complaints today. The patient is sexually active.  The patient participates in regular exercise: no. Has the patient ever been transfused or tattooed?: yes. The patient reports that there is not domestic violence in her life.    Gynecologic History No LMP recorded. Patient has had an implant. Menstrual History: OB History    Gravida  2   Para  2   Term  1   Preterm  1   AB      Living  2     SAB      TAB      Ectopic      Multiple  0   Live Births  2        Obstetric Comments  ZIKA EXPOSURE SCREEN:  The patient has not traveled to a [place] endemic area within the past 6 months, nor has she had unprotected sex with a partner who has travelled to a [place] endemic region within the past 6 months. The patient has been advised  to notify us if these factors change any time during this current pregnancy, so adequate testing and monitoring can be initiated.Savannah AuerLora E Richmond for NOB nurse interview visit. G-.   P-. Pregnancy eduction material explained and given. NOB labs ordered. HIV  consent form signed. PNV encouraged. NT ordered/declined/to discuss with provider. Pt. To follow up with provider in __ weeks for NOB physical.  All questions answered.          Menarche age: [age] or [age] No LMP recorded. Patient has had an implant.  Notes irregularity of cycles with Nexplanon.  Contraception: Nexplanon, inserted 01/2019.  History of STI's: Denies Last Pap: 06/10/2012. Results were: normal.  Denies/Notes h/o abnormal pap smears.     OB History  Gravida Para Term Preterm AB Living  2 2 1 1  0 2  SAB TAB Ectopic Multiple Live Births  0 0 0 0 2    # Outcome Date GA Lbr Len/2nd Weight Sex Delivery Anes PTL Lv  2 Preterm 06/07/15 2140w3d / 00:16 6 lb 2.8 oz (2.8 kg) M Vag-Spont None  LIV     Name: Savannah Richmond     Apgar1: 1   Apgar5: 5  1 Term 10/22/08 417w3d  7 lb 15 oz (3.6 kg) F Vag-Spont  N LIV    Obstetric Comments  ZIKA EXPOSURE SCREEN:    The patient has not traveled to a [place] endemic area within the past 6 months, nor has she had unprotected sex with a partner who has travelled to a [place] endemic region within the past 6 months.  The patient has been advised to notify us if these factors change any time during this current pregnancy, so adequate testing and monitoring can be initiated.Savannah AuerLora E Richmond for NOB nurse interview visit. G-.   P-. Pregnancy eduction material explained and given. NOB labs ordered. HIV consent form signed. PNV encouraged. NT ordered/declined/to discuss with provider. Pt. To follow up with provider in __ weeks for NOB physical.  All questions answered.      Past Medical History:  Diagnosis Date  . Anxiety and depression 03/09/2014  . History of irregular menstrual bleeding     Past Surgical History:  Procedure Laterality Date  . NO PAST SURGERIES      Family History  Problem Relation  Age of Onset  . Healthy Mother   . Healthy Father   . Cancer Maternal Grandmother 56       Started as breast cancer then mastasized-stage 4  . Cancer Maternal Grandfather        prostate cancer    Social History   Socioeconomic History  . Marital status: Single    Spouse name: Not on file  . Number of children: Not on file  . Years of education: Not on file  . Highest education level: Not on file  Occupational History  . Occupation: WORKS WITH CHEMICALS  Social Needs  . Financial resource strain: Not on file  . Food insecurity    Worry: Not on file    Inability: Not on file  . Transportation needs    Medical: Not on file    Non-medical: Not on file  Tobacco Use  . Smoking status: Former Smoker    Packs/day: 0.25    Types: Cigarettes    Quit date: 06/29/2018    Years since quitting: 0.8  . Smokeless tobacco: Never Used  Substance and Sexual Activity  . Alcohol use: No     Alcohol/week: 0.0 standard drinks  . Drug use: No  . Sexual activity: Yes    Partners: Male    Birth control/protection: Implant  Lifestyle  . Physical activity    Days per week: Not on file    Minutes per session: Not on file  . Stress: Not on file  Relationships  . Social Musician on phone: Not on file    Gets together: Not on file    Attends religious service: Not on file    Active member of club or organization: Not on file    Attends meetings of clubs or organizations: Not on file    Relationship status: Not on file  . Intimate partner violence    Fear of current or ex partner: Not on file    Emotionally abused: Not on file    Physically abused: Not on file    Forced sexual activity: Not on file  Other Topics Concern  . Not on file  Social History Narrative   WORKS WITH [employer]    Current Outpatient Medications on File Prior to Visit  Medication Sig Dispense Refill  . citalopram (CELEXA) 40 MG tablet Take by mouth.    . etonogestrel (NEXPLANON) 68 MG IMPL implant 1 each by Subdermal route once.     No current facility-administered medications on file prior to visit.     No Known Allergies    Review of Systems Constitutional: negative for chills, fatigue, fevers and sweats Eyes: negative for irritation, redness and visual disturbance Ears, nose, mouth, throat, and face: negative for hearing loss, nasal congestion, snoring and tinnitus Respiratory: negative for asthma, cough, sputum Cardiovascular: negative for chest pain, dyspnea, exertional chest pressure/discomfort, irregular heart beat, palpitations and syncope Gastrointestinal: negative for abdominal pain, change in bowel habits, nausea and vomiting Genitourinary: negative for abnormal menstrual periods, genital lesions, sexual problems and vaginal discharge, dysuria and urinary incontinence Integument/breast: negative for breast lump, breast tenderness and nipple discharge  Hematologic/lymphatic: negative for bleeding and easy bruising Musculoskeletal:negative for back pain and muscle weakness Neurological: negative for dizziness, headaches, vertigo and weakness Endocrine: negative for diabetic symptoms including polydipsia, polyuria and skin dryness Allergic/Immunologic: negative for hay fever and urticaria      Objective:  Blood pressure 122/79, pulse 74, height 5\' 3"  (1.6 m), weight 129 lb  1.6 oz (58.6 kg). Body mass index is 22.87 kg/m.  General Appearance:    Alert, cooperative, no distress, appears stated age   Head:    Normocephalic, without obvious abnormality, atraumatic  Eyes:    PERRL, conjunctiva/corneas clear, EOM's intact, both eyes  Ears:    Normal external ear canals, both ears  Nose:   Nares normal, septum midline, mucosa normal, no drainage or sinus tenderness  Throat:   Lips, mucosa, and tongue normal; teeth and gums normal  Neck:   Supple, symmetrical, trachea midline, no adenopathy; thyroid: no enlargement/tenderness/nodules; no carotid bruit or JVD  Back:     Symmetric, no curvature, ROM normal, no CVA tenderness  Lungs:     Clear to auscultation bilaterally, respirations unlabored  Chest Wall:    No tenderness or deformity   Heart:    Regular rate and rhythm, S1 and S2 normal, no murmur, rub or gallop  Breast Exam:    No tenderness, masses, or nipple abnormality  Abdomen:     Soft, non-tender, bowel sounds active all four quadrants, no masses, no organomegaly.    Genitalia:    Pelvic:external genitalia normal, vagina without lesions, discharge, or tenderness, rectovaginal septum  normal. Cervix normal in appearance, no cervical motion tenderness, no adnexal masses or tenderness.  Uterus normal size, shape, mobile, regular contours, nontender.  Rectal:    Normal external sphincter.  No hemorrhoids appreciated. Internal exam not done.   Extremities:   Extremities normal, atraumatic, no cyanosis or edema  Pulses:   2+ and symmetric all  extremities  Skin:   Skin color, texture, turgor normal, no rashes or lesions  Lymph nodes:   Cervical, supraclavicular, and axillary nodes normal  Neurologic:   CNII-XII intact, normal strength, sensation and reflexes throughout   .  Labs:  Lab Results  Component Value Date   WBC 6.0 01/22/2017   HGB 13.4 01/22/2017   HCT 40.5 01/22/2017   MCV 96 01/22/2017   PLT 216 01/22/2017    Lab Results  Component Value Date   CREATININE 0.80 11/07/2015   BUN 8 11/07/2015   NA 139 11/07/2015   K 3.9 11/07/2015   CL 101 11/07/2015   CO2 20 11/07/2015    Lab Results  Component Value Date   ALT 24 11/07/2015   AST 27 11/07/2015   ALKPHOS 66 11/07/2015   BILITOT 0.9 11/07/2015    Lab Results  Component Value Date   TSH 0.688 01/22/2017     Assessment:    Healthy female exam.   Nexplanon in place  Plan:     Blood tests: CBC with diff and Comprehensive metabolic panel. Breast self exam technique reviewed and patient encouraged to perform self-exam monthly. Contraception: Nexplanon. Discussed healthy lifestyle modifications. Pap smear performed today.   Up to date on flu vaccine.  RTC in 1 year for annual exam.     Rubie Maid, MD [facility]

## 2019-04-30 NOTE — Progress Notes (Signed)
Pt is present for annual exam. Pt completed her flu vaccine at work on 03/23/19. Pt 's last pap 06/10/2012. Pt stated that she was doing well and denied any gyn issues at this time.

## 2019-04-30 NOTE — Patient Instructions (Signed)
Preventive Care 20-30 Years Old, Female Preventive care refers to visits with your health care provider and lifestyle choices that can promote health and wellness. This includes:  A yearly physical exam. This may also be called an annual well check.  Regular dental visits and eye exams.  Immunizations.  Screening for certain conditions.  Healthy lifestyle choices, such as eating a healthy diet, getting regular exercise, not using drugs or products that contain nicotine and tobacco, and limiting alcohol use. What can I expect for my preventive care visit? Physical exam Your health care provider will check your:  Height and weight. This may be used to calculate body mass index (BMI), which tells if you are at a healthy weight.  Heart rate and blood pressure.  Skin for abnormal spots. Counseling Your health care provider may ask you questions about your:  Alcohol, tobacco, and drug use.  Emotional well-being.  Home and relationship well-being.  Sexual activity.  Eating habits.  Work and work Statistician.  Method of birth control.  Menstrual cycle.  Pregnancy history. What immunizations do I need?  Influenza (flu) vaccine  This is recommended every year. Tetanus, diphtheria, and pertussis (Tdap) vaccine  You may need a Td booster every 10 years. Varicella (chickenpox) vaccine  You may need this if you have not been vaccinated. Human papillomavirus (HPV) vaccine  If recommended by your health care provider, you may need three doses over 6 months. Measles, mumps, and rubella (MMR) vaccine  You may need at least one dose of MMR. You may also need a second dose. Meningococcal conjugate (MenACWY) vaccine  One dose is recommended if you are age 75-21 years and a first-year college student living in a residence hall, or if you have one of several medical conditions. You may also need additional booster doses. Pneumococcal conjugate (PCV13) vaccine  You may need  this if you have certain conditions and were not previously vaccinated. Pneumococcal polysaccharide (PPSV23) vaccine  You may need one or two doses if you smoke cigarettes or if you have certain conditions. Hepatitis A vaccine  You may need this if you have certain conditions or if you travel or work in places where you may be exposed to hepatitis A. Hepatitis B vaccine  You may need this if you have certain conditions or if you travel or work in places where you may be exposed to hepatitis B. Haemophilus influenzae type b (Hib) vaccine  You may need this if you have certain conditions. You may receive vaccines as individual doses or as more than one vaccine together in one shot (combination vaccines). Talk with your health care provider about the risks and benefits of combination vaccines. What tests do I need?  Blood tests  Lipid and cholesterol levels. These may be checked every 5 years starting at age 33.  Hepatitis C test.  Hepatitis B test. Screening  Diabetes screening. This is done by checking your blood sugar (glucose) after you have not eaten for a while (fasting).  Sexually transmitted disease (STD) testing.  BRCA-related cancer screening. This may be done if you have a family history of breast, ovarian, tubal, or peritoneal cancers.  Pelvic exam and Pap test. This may be done every 3 years starting at age 76. Starting at age 102, this may be done every 5 years if you have a Pap test in combination with an HPV test. Talk with your health care provider about your test results, treatment options, and if necessary, the need for more tests.  Follow these instructions at home: °Eating and drinking ° °· Eat a diet that includes fresh fruits and vegetables, whole grains, lean protein, and low-fat dairy. °· Take vitamin and mineral supplements as recommended by your health care provider. °· Do not drink alcohol if: °? Your health care provider tells you not to drink. °? You are  pregnant, may be pregnant, or are planning to become pregnant. °· If you drink alcohol: °? Limit how much you have to 0-1 drink a day. °? Be aware of how much alcohol is in your drink. In the [place] one drink equals one 12 oz bottle of beer (355 mL), one 5 oz glass of wine (148 mL), or one 1½ oz glass of hard liquor (44 mL). °Lifestyle °· Take daily care of your teeth and gums. °· Stay active. Exercise for at least 30 minutes on 5 or more days each week. °· Do not use any products that contain nicotine or tobacco, such as cigarettes, e-cigarettes, and chewing tobacco. If you need help quitting, ask your health care provider. °· If you are sexually active, practice safe sex. Use a condom or other form of birth control (contraception) in order to prevent pregnancy and STIs (sexually transmitted infections). If you plan to become pregnant, see your health care provider for a preconception visit. °What's next? °· Visit your health care provider once a year for a well check visit. °· Ask your health care provider how often you should have your eyes and teeth checked. °· Stay up to date on all vaccines. °This information is not intended to replace advice given to you by your health care provider. Make sure you discuss any questions you have with your health care provider. °Document Released: 07/23/2001 Document Revised: 02/05/2018 Document Reviewed: 02/05/2018 °[employer]vier Patient Education © 2020 [employer] °Breast Self-Awareness °Breast self-awareness is knowing how your breasts look and feel. Doing breast self-awareness is important. It allows you to catch a breast problem early while it is still small and can be treated. All women should do breast self-awareness, including women who have had breast implants. Tell your doctor if you notice a change in your breasts. °What you need: °· A mirror. °· A well-lit room. °How to do a breast self-exam °A breast self-exam is one way to learn what is normal for your breasts and to  check for changes. To do a breast self-exam: °Look for changes ° °1. Take off all the clothes above your waist. °2. Stand in front of a mirror in a room with good lighting. °3. Put your hands on your hips. °4. Push your hands down. °5. Look at your breasts and nipples in the mirror to see if one breast or nipple looks different from the other. Check to see if: °? The shape of one breast is different. °? The size of one breast is different. °? There are wrinkles, dips, and bumps in one breast and not the other. °6. Look at each breast for changes in the skin, such as: °? Redness. °? Scaly areas. °7. Look for changes in your nipples, such as: °? Liquid around the nipples. °? Bleeding. °? Dimpling. °? Redness. °? A change in where the nipples are. °Feel for changes ° °1. Lie on your back on the floor. °2. Feel each breast. To do this, follow these steps: °? Pick a breast to feel. °? Put the arm closest to that breast above your head. °? Use your other arm to feel the nipple area of   your breast. Feel the area with the pads of your three middle fingers by making small circles with your fingers. For the first circle, press lightly. For the second circle, press harder. For the third circle, press even harder. °? Keep making circles with your fingers at the different pressures as you move down your breast. Stop when you feel your ribs. °? Move your fingers a little toward the center of your body. °? Start making circles with your fingers again, this time going up until you reach your collarbone. °? Keep making up-and-down circles until you reach your armpit. Remember to keep using the three pressures. °? Feel the other breast in the same way. °3. Sit or stand in the tub or shower. °4. With soapy water on your skin, feel each breast the same way you did in step 2 when you were lying on the floor. °Write down what you find °Writing down what you find can help you remember what to tell your doctor. Write down: °· What is  normal for each breast. °· Any changes you find in each breast, including: °? The kind of changes you find. °? Whether you have pain. °? Size and location of any lumps. °· When you last had your menstrual period. °General tips °· Check your breasts every month. °· If you are breastfeeding, the best time to check your breasts is after you feed your baby or after you use a breast pump. °· If you get menstrual periods, the best time to check your breasts is 5-7 days after your menstrual period is over. °· With time, you will become comfortable with the self-exam, and you will begin to know if there are changes in your breasts. °Contact a doctor if you: °· See a change in the shape or size of your breasts or nipples. °· See a change in the skin of your breast or nipples, such as red or scaly skin. °· Have fluid coming from your nipples that is not normal. °· Find a lump or thick area that was not there before. °· Have pain in your breasts. °· Have any concerns about your breast health. °Summary °· Breast self-awareness includes looking for changes in your breasts, as well as feeling for changes within your breasts. °· Breast self-awareness should be done in front of a mirror in a well-lit room. °· You should check your breasts every month. If you get menstrual periods, the best time to check your breasts is 5-7 days after your menstrual period is over. °· Let your doctor know of any changes you see in your breasts, including changes in size, changes on the skin, pain or tenderness, or fluid from your nipples that is not normal. °This information is not intended to replace advice given to you by your health care provider. Make sure you discuss any questions you have with your health care provider. °Document Released: 11/13/2007 Document Revised: 01/13/2018 Document Reviewed: 01/13/2018 °[employer] Patient Education © 2020 [employer] ° °

## 2019-05-01 LAB — COMPREHENSIVE METABOLIC PANEL
ALT: 28 IU/L (ref 0–32)
AST: 27 IU/L (ref 0–40)
Albumin/Globulin Ratio: 2.3 — ABNORMAL HIGH (ref 1.2–2.2)
Albumin: 5 g/dL (ref 3.9–5.0)
Alkaline Phosphatase: 48 IU/L (ref 39–117)
BUN/Creatinine Ratio: 12 (ref 9–23)
BUN: 11 mg/dL (ref 6–20)
Bilirubin Total: 0.8 mg/dL (ref 0.0–1.2)
CO2: 19 mmol/L — ABNORMAL LOW (ref 20–29)
Calcium: 9.5 mg/dL (ref 8.7–10.2)
Chloride: 101 mmol/L (ref 96–106)
Creatinine, Ser: 0.89 mg/dL (ref 0.57–1.00)
GFR calc Af Amer: 101 mL/min/{1.73_m2} (ref >59)
GFR calc non Af Amer: 88 mL/min/{1.73_m2} (ref >59)
Globulin, Total: 2.2 g/dL (ref 1.5–4.5)
Glucose: 88 mg/dL (ref 65–99)
Potassium: 4.9 mmol/L (ref 3.5–5.2)
Sodium: 139 mmol/L (ref 134–144)
Total Protein: 7.2 g/dL (ref 6.0–8.5)

## 2019-05-01 LAB — CBC
Hematocrit: 37.1 % (ref 34.0–46.6)
Hemoglobin: 12.8 g/dL (ref 11.1–15.9)
MCH: 32.2 pg (ref 26.6–33.0)
MCHC: 34.5 g/dL (ref 31.5–35.7)
MCV: 93 fL (ref 79–97)
Platelets: 198 10*3/uL (ref 150–450)
RBC: 3.98 x10E6/uL (ref 3.77–5.28)
RDW: 13 % (ref 11.7–15.4)
WBC: 5.6 10*3/uL (ref 3.4–10.8)

## 2019-05-05 LAB — CYTOLOGY - PAP
Comment: NEGATIVE
Diagnosis: UNDETERMINED — AB
High risk HPV: NEGATIVE

## 2019-05-13 DIAGNOSIS — F419 Anxiety disorder, unspecified: Secondary | ICD-10-CM | POA: Diagnosis not present

## 2019-05-13 DIAGNOSIS — R079 Chest pain, unspecified: Secondary | ICD-10-CM | POA: Diagnosis not present

## 2019-05-13 DIAGNOSIS — R000 Tachycardia, unspecified: Secondary | ICD-10-CM | POA: Diagnosis not present

## 2019-05-13 DIAGNOSIS — F329 Major depressive disorder, single episode, unspecified: Secondary | ICD-10-CM | POA: Diagnosis not present

## 2019-05-26 DIAGNOSIS — R002 Palpitations: Secondary | ICD-10-CM | POA: Diagnosis not present

## 2019-05-26 DIAGNOSIS — F419 Anxiety disorder, unspecified: Secondary | ICD-10-CM | POA: Diagnosis not present

## 2019-05-26 DIAGNOSIS — R0789 Other chest pain: Secondary | ICD-10-CM | POA: Diagnosis not present

## 2019-05-26 DIAGNOSIS — R5382 Chronic fatigue, unspecified: Secondary | ICD-10-CM | POA: Diagnosis not present
